# Patient Record
Sex: Female | Born: 1960 | Race: White | Hispanic: No | Marital: Married | Smoking: Never smoker
Health system: Southern US, Community
[De-identification: ages and names within clinical notes are randomized; demographics above are authoritative.]

## PROBLEM LIST (undated history)

## (undated) DIAGNOSIS — Z8719 Personal history of other diseases of the digestive system: Secondary | ICD-10-CM

## (undated) DIAGNOSIS — T8859XA Other complications of anesthesia, initial encounter: Secondary | ICD-10-CM

## (undated) DIAGNOSIS — E559 Vitamin D deficiency, unspecified: Secondary | ICD-10-CM

## (undated) DIAGNOSIS — G8929 Other chronic pain: Secondary | ICD-10-CM

## (undated) DIAGNOSIS — E119 Type 2 diabetes mellitus without complications: Secondary | ICD-10-CM

## (undated) DIAGNOSIS — T4145XA Adverse effect of unspecified anesthetic, initial encounter: Secondary | ICD-10-CM

## (undated) DIAGNOSIS — E049 Nontoxic goiter, unspecified: Secondary | ICD-10-CM

## (undated) DIAGNOSIS — O24419 Gestational diabetes mellitus in pregnancy, unspecified control: Secondary | ICD-10-CM

## (undated) DIAGNOSIS — T884XXA Failed or difficult intubation, initial encounter: Secondary | ICD-10-CM

## (undated) DIAGNOSIS — J42 Unspecified chronic bronchitis: Secondary | ICD-10-CM

## (undated) DIAGNOSIS — K219 Gastro-esophageal reflux disease without esophagitis: Secondary | ICD-10-CM

## (undated) DIAGNOSIS — M545 Low back pain: Secondary | ICD-10-CM

## (undated) HISTORY — PX: TONSILLECTOMY: SUR1361

## (undated) HISTORY — PX: COLONOSCOPY: SHX174

## (undated) HISTORY — PX: WISDOM TOOTH EXTRACTION: SHX21

## (undated) HISTORY — DX: Nontoxic goiter, unspecified: E04.9

## (undated) HISTORY — DX: Vitamin D deficiency, unspecified: E55.9

## (undated) HISTORY — PX: HERNIA REPAIR: SHX51

## (undated) HISTORY — DX: Gestational diabetes mellitus in pregnancy, unspecified control: O24.419

---

## 2001-01-07 ENCOUNTER — Encounter: Admission: RE | Admit: 2001-01-07 | Discharge: 2001-01-07 | Payer: Self-pay | Admitting: *Deleted

## 2001-01-18 ENCOUNTER — Other Ambulatory Visit: Admission: RE | Admit: 2001-01-18 | Discharge: 2001-01-18 | Payer: Self-pay | Admitting: *Deleted

## 2002-03-18 ENCOUNTER — Other Ambulatory Visit: Admission: RE | Admit: 2002-03-18 | Discharge: 2002-03-18 | Payer: Self-pay | Admitting: Obstetrics & Gynecology

## 2002-06-17 ENCOUNTER — Encounter: Payer: Self-pay | Admitting: Emergency Medicine

## 2002-06-17 ENCOUNTER — Emergency Department (HOSPITAL_COMMUNITY): Admission: EM | Admit: 2002-06-17 | Discharge: 2002-06-17 | Payer: Self-pay | Admitting: Emergency Medicine

## 2003-03-19 ENCOUNTER — Other Ambulatory Visit: Admission: RE | Admit: 2003-03-19 | Discharge: 2003-03-19 | Payer: Self-pay | Admitting: Obstetrics and Gynecology

## 2007-06-10 ENCOUNTER — Other Ambulatory Visit: Admission: RE | Admit: 2007-06-10 | Discharge: 2007-06-10 | Payer: Self-pay | Admitting: Obstetrics and Gynecology

## 2007-06-12 ENCOUNTER — Encounter: Admission: RE | Admit: 2007-06-12 | Discharge: 2007-06-12 | Payer: Self-pay | Admitting: Obstetrics and Gynecology

## 2007-08-28 ENCOUNTER — Encounter: Admission: RE | Admit: 2007-08-28 | Discharge: 2007-08-28 | Payer: Self-pay | Admitting: Endocrinology

## 2007-09-10 ENCOUNTER — Other Ambulatory Visit: Admission: RE | Admit: 2007-09-10 | Discharge: 2007-09-10 | Payer: Self-pay | Admitting: Diagnostic Radiology

## 2007-09-10 ENCOUNTER — Encounter (INDEPENDENT_AMBULATORY_CARE_PROVIDER_SITE_OTHER): Payer: Self-pay | Admitting: Diagnostic Radiology

## 2007-09-10 ENCOUNTER — Encounter: Admission: RE | Admit: 2007-09-10 | Discharge: 2007-09-10 | Payer: Self-pay | Admitting: Endocrinology

## 2008-07-20 ENCOUNTER — Ambulatory Visit: Payer: Self-pay | Admitting: Family Medicine

## 2008-07-20 DIAGNOSIS — H6990 Unspecified Eustachian tube disorder, unspecified ear: Secondary | ICD-10-CM | POA: Insufficient documentation

## 2008-07-20 DIAGNOSIS — E049 Nontoxic goiter, unspecified: Secondary | ICD-10-CM | POA: Insufficient documentation

## 2008-07-20 DIAGNOSIS — H6980 Other specified disorders of Eustachian tube, unspecified ear: Secondary | ICD-10-CM

## 2008-08-04 ENCOUNTER — Ambulatory Visit: Payer: Self-pay | Admitting: Family Medicine

## 2008-08-04 LAB — CONVERTED CEMR LAB
Bilirubin Urine: NEGATIVE
Blood in Urine, dipstick: NEGATIVE
Glucose, Urine, Semiquant: NEGATIVE
Ketones, urine, test strip: NEGATIVE
Nitrite: NEGATIVE
Protein, U semiquant: NEGATIVE
Specific Gravity, Urine: 1.015
Urobilinogen, UA: 0.2
WBC Urine, dipstick: NEGATIVE
pH: 5.5

## 2008-08-05 ENCOUNTER — Encounter: Payer: Self-pay | Admitting: Family Medicine

## 2008-08-05 LAB — CONVERTED CEMR LAB
AST: 22 units/L (ref 0–37)
Albumin: 3.9 g/dL (ref 3.5–5.2)
Alkaline Phosphatase: 81 units/L (ref 39–117)
Basophils Absolute: 0 10*3/uL (ref 0.0–0.1)
Basophils Relative: 0.6 % (ref 0.0–3.0)
CO2: 26 meq/L (ref 19–32)
Calcium: 8.8 mg/dL (ref 8.4–10.5)
Chloride: 106 meq/L (ref 96–112)
Eosinophils Absolute: 0.3 10*3/uL (ref 0.0–0.7)
Glucose, Bld: 121 mg/dL — ABNORMAL HIGH (ref 70–99)
HCT: 40.4 % (ref 36.0–46.0)
HDL: 43.4 mg/dL (ref >39.00)
Hemoglobin: 13.9 g/dL (ref 12.0–15.0)
Lymphocytes Relative: 20.9 % (ref 12.0–46.0)
Lymphs Abs: 1.5 10*3/uL (ref 0.7–4.0)
MCHC: 34.5 g/dL (ref 30.0–36.0)
MCV: 88.7 fL (ref 78.0–100.0)
Neutro Abs: 4.9 10*3/uL (ref 1.4–7.7)
Potassium: 4.1 meq/L (ref 3.5–5.1)
RBC: 4.55 M/uL (ref 3.87–5.11)
RDW: 12.6 % (ref 11.5–14.6)
Sodium: 138 meq/L (ref 135–145)
TSH: 0.34 microintl units/mL — ABNORMAL LOW (ref 0.35–5.50)
Total CHOL/HDL Ratio: 4
Total Protein: 6.7 g/dL (ref 6.0–8.3)

## 2008-08-07 ENCOUNTER — Telehealth: Payer: Self-pay | Admitting: Family Medicine

## 2008-09-01 ENCOUNTER — Encounter: Payer: Self-pay | Admitting: Family Medicine

## 2008-12-10 ENCOUNTER — Ambulatory Visit: Payer: Self-pay | Admitting: Family Medicine

## 2008-12-10 DIAGNOSIS — J209 Acute bronchitis, unspecified: Secondary | ICD-10-CM

## 2009-04-13 ENCOUNTER — Other Ambulatory Visit: Admission: RE | Admit: 2009-04-13 | Discharge: 2009-04-13 | Payer: Self-pay | Admitting: Obstetrics and Gynecology

## 2009-04-16 ENCOUNTER — Encounter: Admission: RE | Admit: 2009-04-16 | Discharge: 2009-04-16 | Payer: Self-pay | Admitting: Endocrinology

## 2009-05-03 ENCOUNTER — Ambulatory Visit: Payer: Self-pay | Admitting: Family Medicine

## 2009-05-11 ENCOUNTER — Telehealth: Payer: Self-pay | Admitting: Family Medicine

## 2009-05-17 ENCOUNTER — Telehealth: Payer: Self-pay | Admitting: Family Medicine

## 2009-05-19 IMAGING — US US BIOPSY
1 series · 10 of 10 positions shown · non-contrast
Comparison: none

CLINICAL HISTORY: Dominate left thyroid nodule.

[Series 1: us biopsy · 0.10mm/px · 10 acquisitions, 10 frames shown]
[im 1/10]
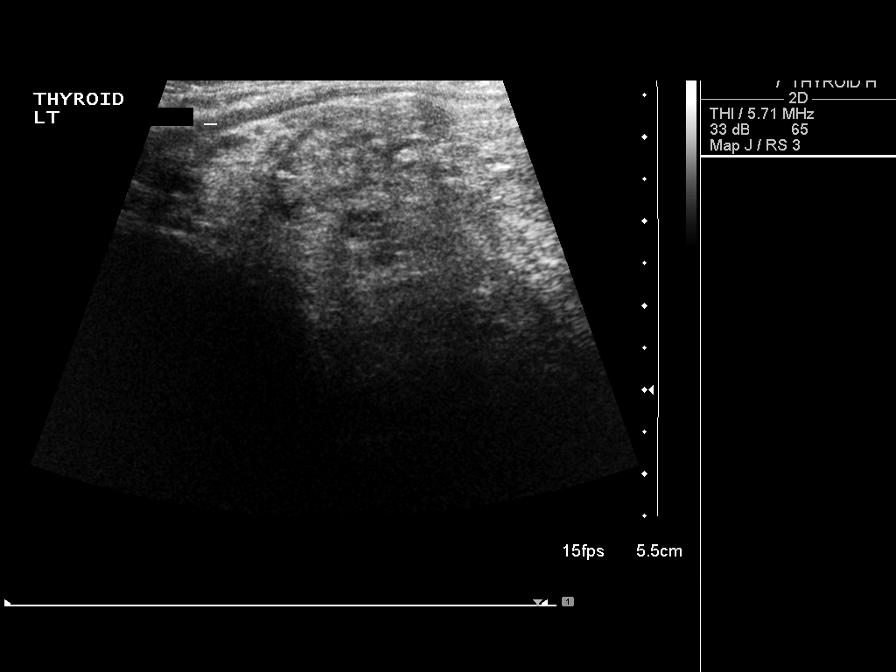
[im 2/10]
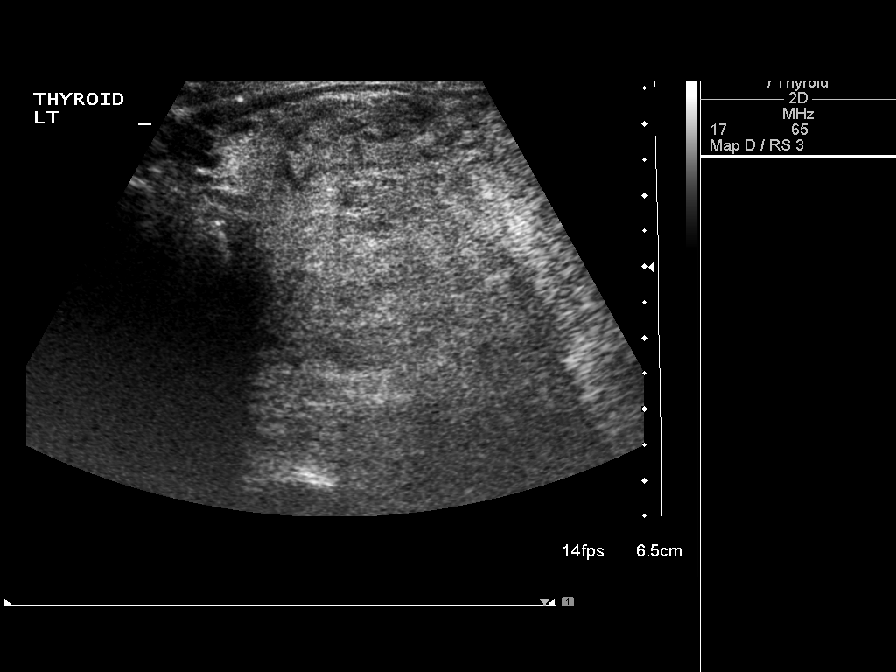
[im 3/10]
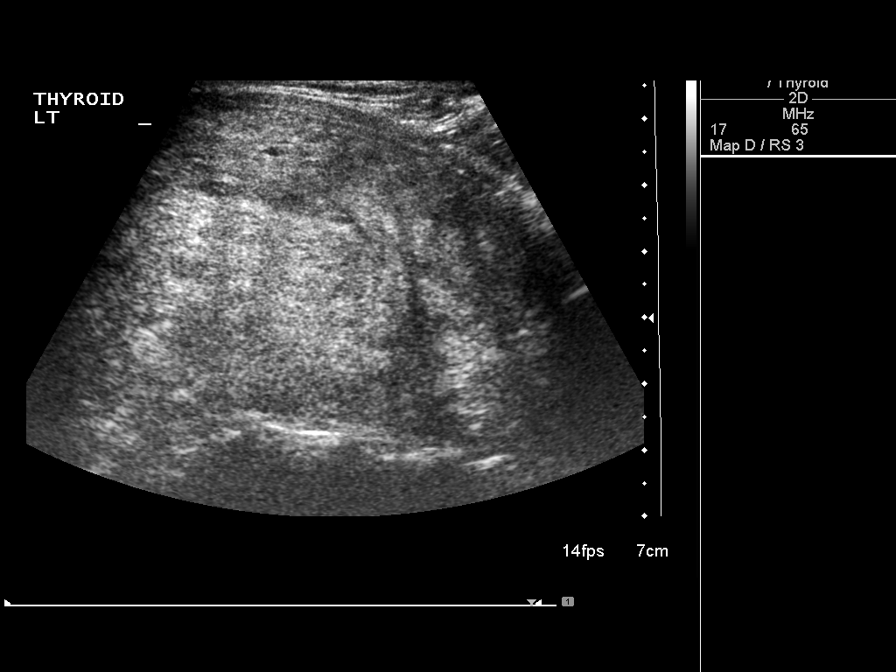
[im 4/10]
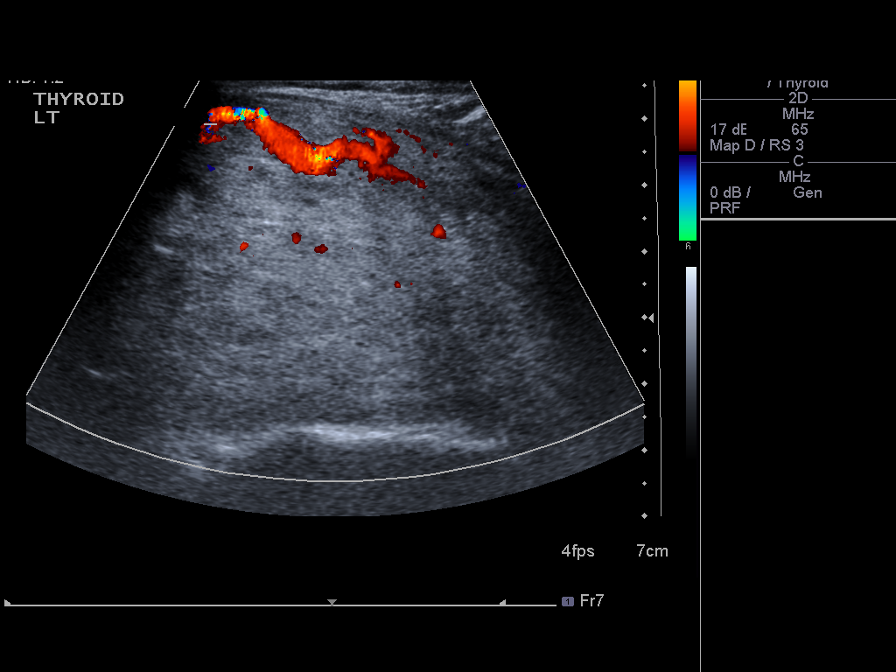
[im 5/10]
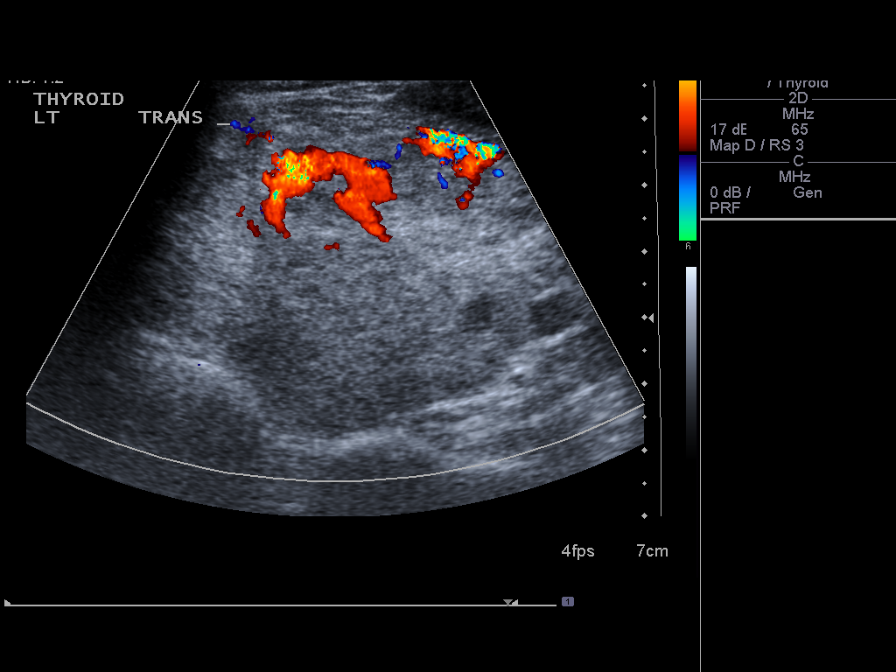
[im 6/10]
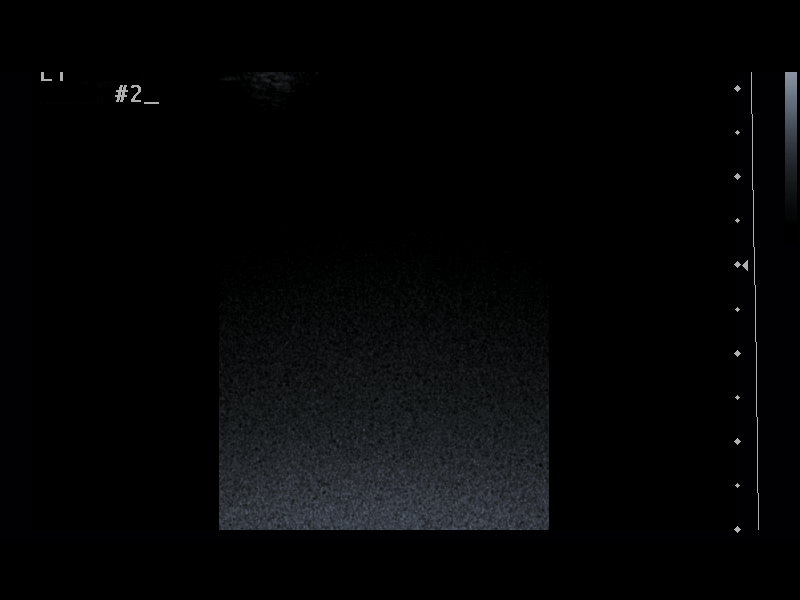
[im 7/10]
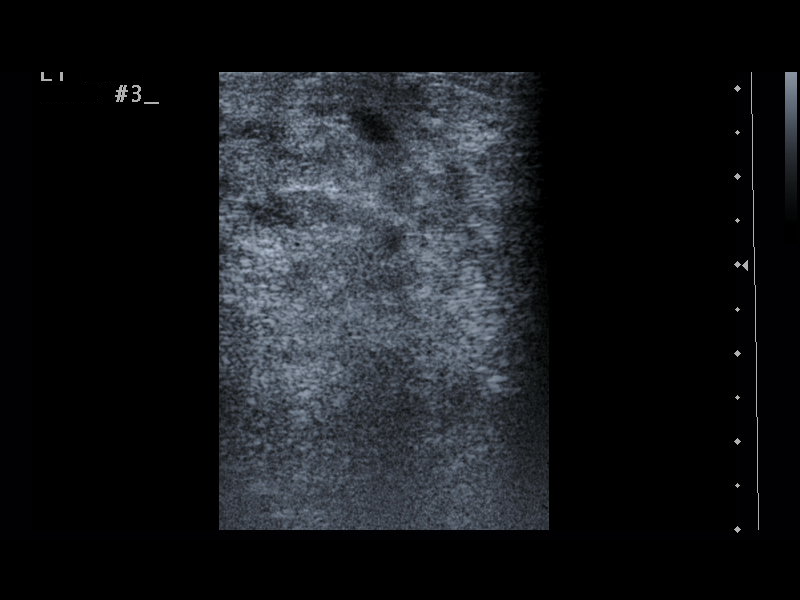
[im 8/10]
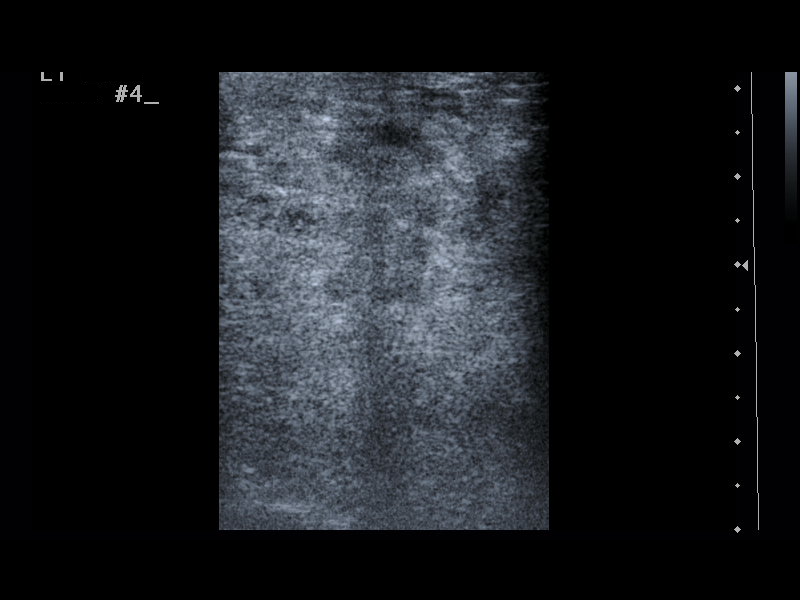
[im 9/10]
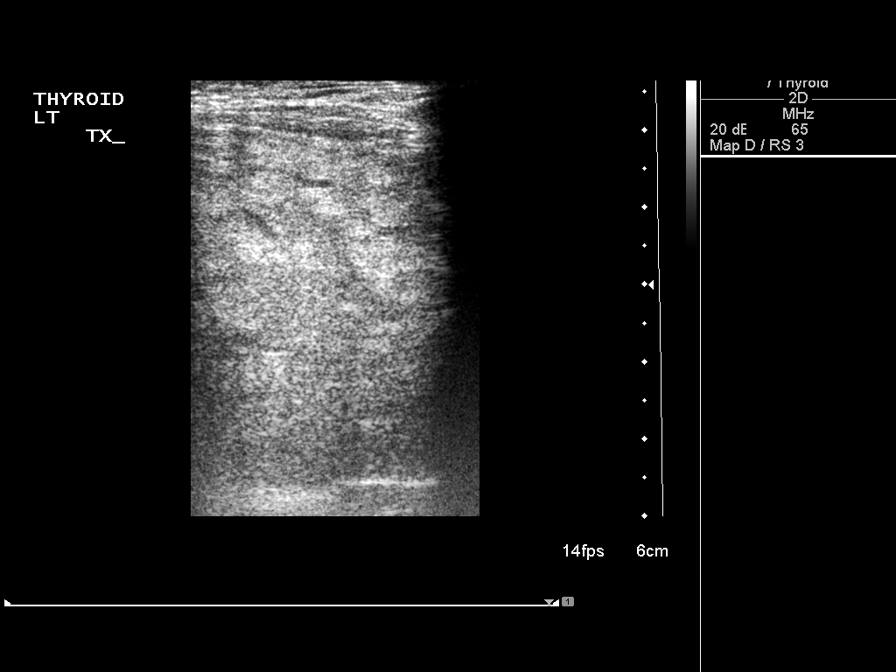
[im 10/10]
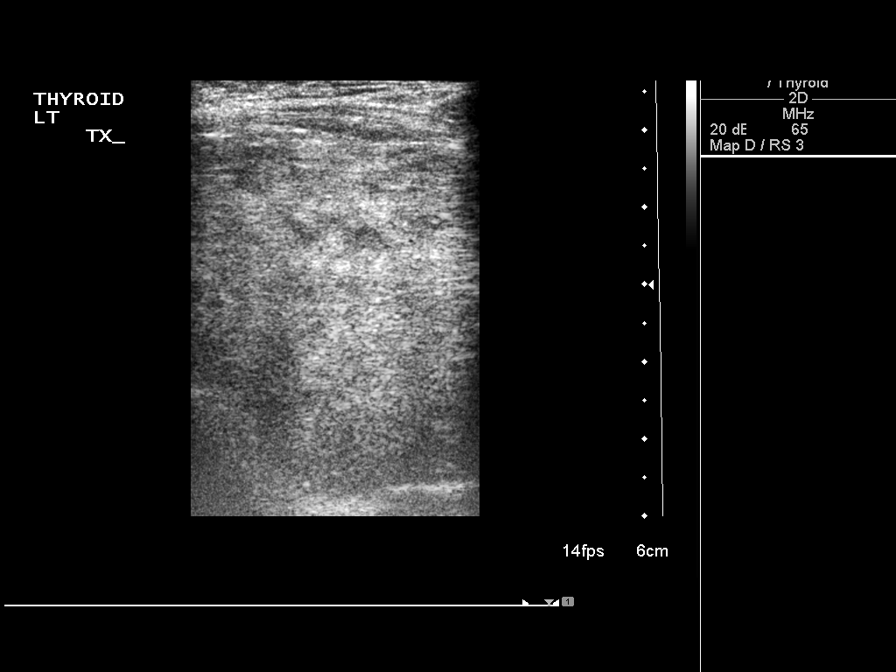

[10 of 10 positions shown; findings below may reference images not displayed]

PROCEDURE(S): ULTRASOUND GUIDED LEFT THYROID NODULE FINE NEEDLE
ASPIRATION

Medications:None

Sedation time:None

Procedure:Informed consent was obtained for a left thyroid nodule
biopsy.  The neck was cleaned with Betadine.  Sterile drape was
placed.  The skin was anesthetized 1% lidocaine.  The left thyroid
nodule was aspirated four times using 25 gauge needles with
ultrasound guidance.  No immediate complications.
FINDINGS: Majority of the left thyroid lobe contains a heterogeneous
nodule.
IMPRESSION: Ultrasound guided fine needle aspirations of the large
left thyroid nodule.

## 2009-10-08 ENCOUNTER — Ambulatory Visit: Payer: Self-pay | Admitting: Family Medicine

## 2009-10-08 DIAGNOSIS — J019 Acute sinusitis, unspecified: Secondary | ICD-10-CM | POA: Insufficient documentation

## 2009-10-12 ENCOUNTER — Telehealth: Payer: Self-pay | Admitting: Family Medicine

## 2009-11-29 ENCOUNTER — Telehealth: Payer: Self-pay | Admitting: Family Medicine

## 2010-02-22 NOTE — Progress Notes (Signed)
Summary: Zpack  Phone Note Call from Patient   Summary of Call: Pt is asking for a Zpack.......Marland Kitchenhaving congestion in head, coughing up yellow mucus...Marland KitchenMarland KitchenMarland Kitchenheadache.  No fever Not taking any meds. ill x 2 days.  Going on school trip. [facility] [place] [phone] Initial call taken by: Lynann Beaver CMA AAMA,  November 29, 2009 3:44 PM  Follow-up for Phone Call        call in a Zpack Follow-up by: Nelwyn Salisbury MD,  November 30, 2009 10:44 AM  Additional Follow-up for Phone Call Additional follow up Details #1::        done. and mess left on vm rx called in .  Additional Follow-up by: Pura Spice, RN,  November 30, 2009 11:14 AM    New/Updated Medications: AZITHROMYCIN 250 MG  TABS (AZITHROMYCIN) 2 by  mouth today and then 1 daily for 4 days Prescriptions: AZITHROMYCIN 250 MG  TABS (AZITHROMYCIN) 2 by  mouth today and then 1 daily for 4 days  #6 x 0   Entered by:   Pura Spice, RN   Authorized by:   Nelwyn Salisbury MD   Signed by:   Pura Spice, RN on 11/30/2009   Method used:   [facility] to        [facility] [address] [phone]* (retail)       [address]       [place], [place]  [address]       Ph: [phone] or [phone]       Fax: [phone]   RxID:   [id number]

## 2010-02-22 NOTE — Progress Notes (Signed)
Summary: please advise pt no better  Phone Note Call from Patient Call back at Home Phone [phone]   Caller: Patient Call For: Nelwyn Salisbury MD Summary of Call: Pt was sent 10-08-2009 still has cough please advise. pt stated her arms are hurting Initial call taken by: Heron Sabins,  October 12, 2009 9:15 AM  Follow-up for Phone Call        pt called again. Follow-up by: Warnell Forester,  October 12, 2009 12:45 PM  Additional Follow-up for Phone Call Additional follow up Details #1::        states z packing working cough some better non productive  says arms feel tight on inside. head feels better. ?another abt No Fever  Additional Follow-up by: Pura Spice, RN,  October 12, 2009 1:29 PM    Additional Follow-up for Phone Call Additional follow up Details #2::    informed pt z pack stays in body for 10 days which she was not aware of. instructed her to drink plenty of fluids and no driving with the hydromet and to call on Friday if no better with cough.  Follow-up by: Pura Spice, RN,  October 12, 2009 1:31 PM  Additional Follow-up for Phone Call Additional follow up Details #3:: Details for Additional Follow-up Action Taken: agreed Additional Follow-up by: Nelwyn Salisbury MD,  October 12, 2009 2:08 PM

## 2010-02-22 NOTE — Progress Notes (Signed)
Summary: violent cough & mucus   Phone Note Call from Patient Call back at Home Phone [phone]   Caller: vm Reason for Call: Talk to Nurse, Talk to Doctor Summary of Call: In last week for cough & cold or allergy.  Finished Zpak.  Still using cough syrup hs.  Thick white mucus back of throat.   Cough still ongoing & very violent hard cough. Still have mucus.  Next step?  No fever, short breath, or chest tightness, some wheezing with mucus.   Mucinex DM or plain & Delsym/Rob.  Clear liquids. Rest.  Later OTC allergy med if needed.  Call back as needed. Initial call taken by: Rudy Jew, RN,  May 11, 2009 10:16 AM  Follow-up for Phone Call        call in a 12 day dose pack of 10 mg prednisone Follow-up by: Nelwyn Salisbury MD,  May 11, 2009 2:02 PM  Additional Follow-up for Phone Call Additional follow up Details #1::        LMTCB with pharmacy Additional Follow-up by: Raechel Ache, RN,  May 11, 2009 3:33 PM    New/Updated Medications: PREDNISONE (PAK) 10 MG TABS (PREDNISONE) take as directed Prescriptions: PREDNISONE (PAK) 10 MG TABS (PREDNISONE) take as directed  #12 days x 0   Entered by:   Lynann Beaver CMA   Authorized by:   Nelwyn Salisbury MD   Signed by:   Lynann Beaver CMA on 05/11/2009   Method used:   Electronically to        [facility] [phone]* (retail)       [address]       [place], [place]  [address]       Ph: [phone] or [phone]       Fax: [phone]   RxID:   [id number]  Pt. notified.

## 2010-02-22 NOTE — Assessment & Plan Note (Signed)
Summary: cough/dm   Vital Signs:  Patient profile:   50 year old female Weight:      195 pounds O2 Sat:      97 % Temp:     98.5 degrees F Pulse rate:   102 / minute BP sitting:   124 / 80  (left arm) Cuff size:   large  Vitals Entered By: Pura Spice, RN (October 08, 2009 3:35 PM) CC: runny nose drainage yellow watery eyes. cough    History of Present Illness: Here for one week of sinus pressure, HA, PND, ST, and a dry cough. No fever.   Allergies: 1)  ! Sudafed  Past History:  Past Medical History: Reviewed history from 07/20/2008 and no changes required. Gestational Diabetes, resolved Euthyroid goiter, sees Dr. Rocky Crafts, had normal labs, [place], and biopsies in 2009 sees [facility] regularly sees Dr. Tami Ribas at [facility] for Dermatology exams  Past Surgical History: Reviewed history from 07/20/2008 and no changes required. Tonsillectomy  Review of Systems  The patient denies anorexia, fever, weight loss, weight gain, vision loss, decreased hearing, hoarseness, chest pain, syncope, dyspnea on exertion, peripheral edema, hemoptysis, abdominal pain, melena, hematochezia, severe indigestion/heartburn, hematuria, incontinence, genital sores, muscle weakness, suspicious skin lesions, transient blindness, difficulty walking, depression, unusual weight change, abnormal bleeding, enlarged lymph nodes, angioedema, breast masses, and testicular masses.    Physical Exam  General:  Well-developed,well-nourished,in no acute distress; alert,appropriate and cooperative throughout examination Head:  Normocephalic and atraumatic without obvious abnormalities. No apparent alopecia or balding. Eyes:  No corneal or conjunctival inflammation noted. EOMI. Perrla. Funduscopic exam benign, without hemorrhages, exudates or papilledema. Vision grossly normal. Ears:  External ear exam shows no significant lesions or deformities.  Otoscopic examination reveals clear canals, tympanic membranes  are intact bilaterally without bulging, retraction, inflammation or discharge. Hearing is grossly normal bilaterally. Nose:  External nasal examination shows no deformity or inflammation. Nasal mucosa are pink and moist without lesions or exudates. Mouth:  Oral mucosa and oropharynx without lesions or exudates.  Teeth in good repair. Neck:  No deformities, masses, or tenderness noted. Lungs:  Normal respiratory effort, chest expands symmetrically. Lungs are clear to auscultation, no crackles or wheezes.   Impression & Recommendations:  Problem # 1:  ACUTE SINUSITIS, UNSPECIFIED (ICD-461.9)  The following medications were removed from the medication list:    Hydromet 5-1.5 Mg/61ml Syrp (Hydrocodone-homatropine) .Marland Kitchen... 1 tsp q 4 hours as needed cough Her updated medication list for this problem includes:    Zithromax Z-pak 250 Mg Tabs (Azithromycin) .Marland Kitchen... As directed    Flonase 50 Mcg/act Susp (Fluticasone propionate) .Marland Kitchen... 2 sprays each nostril once daily    Hydromet 5-1.5 Mg/68ml Syrp (Hydrocodone-homatropine) .Marland Kitchen... 1 tsp q 4 hours as needed cough  Complete Medication List: 1)  Aleve 220 Mg Caps (Naproxen sodium) .Marland Kitchen.. 1 by mouth once daily 2)  Zithromax Z-pak 250 Mg Tabs (Azithromycin) .... As directed 3)  Flonase 50 Mcg/act Susp (Fluticasone propionate) .... 2 sprays each nostril once daily 4)  Hydromet 5-1.5 Mg/52ml Syrp (Hydrocodone-homatropine) .Marland Kitchen.. 1 tsp q 4 hours as needed cough  Patient Instructions: 1)  Please schedule a follow-up appointment as needed .  Prescriptions: HYDROMET 5-1.5 MG/5ML SYRP (HYDROCODONE-HOMATROPINE) 1 tsp q 4 hours as needed cough  #240 x 0   Entered and Authorized by:   Nelwyn Salisbury MD   Signed by:   Nelwyn Salisbury MD on 10/08/2009   Method used:   Print then Give to Patient  RxID:   [id number] FLONASE 50 MCG/ACT SUSP (FLUTICASONE PROPIONATE) 2 sprays each nostril once daily  #30 x 11   Entered and Authorized by:   Nelwyn Salisbury MD   Signed by:    Nelwyn Salisbury MD on 10/08/2009   Method used:   Print then Give to Patient   RxID:   [id number] ZITHROMAX Z-PAK 250 MG TABS (AZITHROMYCIN) as directed  #1 x 0   Entered and Authorized by:   Nelwyn Salisbury MD   Signed by:   Nelwyn Salisbury MD on 10/08/2009   Method used:   Print then Give to Patient   RxID:   [id number]
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
## 2010-02-22 NOTE — Progress Notes (Signed)
Summary: Question about Meds.  Phone Note Call from Patient Call back at Home Phone [phone]   Caller: Patient Summary of Call: Out of town is it ok to take muccinex dx with the prednisone Dr. Clent Ridges prescribed.  Not getting relief from cough.  (Call received 05/14/09 @ 9:29am) Initial call taken by: Trixie Dredge,  May 17, 2009 9:00 AM  Follow-up for Phone Call        yes you can take them together Follow-up by: Nelwyn Salisbury MD,  May 18, 2009 8:35 AM  Additional Follow-up for Phone Call Additional follow up Details #1::        Phone Call Completed Additional Follow-up by: Raechel Ache, RN,  May 18, 2009 10:44 AM

## 2010-09-13 ENCOUNTER — Other Ambulatory Visit (HOSPITAL_COMMUNITY)
Admission: RE | Admit: 2010-09-13 | Discharge: 2010-09-13 | Disposition: A | Discharge status: Home / Self Care | Payer: 59 | Source: Ambulatory Visit | Attending: Obstetrics and Gynecology | Admitting: Obstetrics and Gynecology

## 2010-09-13 ENCOUNTER — Other Ambulatory Visit: Payer: Self-pay | Admitting: Obstetrics and Gynecology

## 2010-09-13 DIAGNOSIS — Z01419 Encounter for gynecological examination (general) (routine) without abnormal findings: Secondary | ICD-10-CM | POA: Insufficient documentation

## 2010-09-14 DIAGNOSIS — Z0279 Encounter for issue of other medical certificate: Secondary | ICD-10-CM

## 2010-09-21 ENCOUNTER — Other Ambulatory Visit (INDEPENDENT_AMBULATORY_CARE_PROVIDER_SITE_OTHER): Payer: 59

## 2010-09-21 DIAGNOSIS — Z0000 Encounter for general adult medical examination without abnormal findings: Secondary | ICD-10-CM

## 2010-09-21 LAB — LIPID PANEL: Total CHOL/HDL Ratio: 4

## 2010-09-21 LAB — HEPATIC FUNCTION PANEL
AST: 17 U/L (ref 0–37)
Alkaline Phosphatase: 101 U/L (ref 39–117)
Total Bilirubin: 0.7 mg/dL (ref 0.3–1.2)

## 2010-09-21 LAB — CBC WITH DIFFERENTIAL/PLATELET
Basophils Absolute: 0 10*3/uL (ref 0.0–0.1)
Eosinophils Absolute: 0.2 10*3/uL (ref 0.0–0.7)
Lymphocytes Relative: 22.6 % (ref 12.0–46.0)
Lymphs Abs: 1.8 10*3/uL (ref 0.7–4.0)
Monocytes Absolute: 0.5 10*3/uL (ref 0.1–1.0)
Neutro Abs: 5.3 10*3/uL (ref 1.4–7.7)
RBC: 4.8 Mil/uL (ref 3.87–5.11)
WBC: 7.8 10*3/uL (ref 4.5–10.5)

## 2010-09-21 LAB — BASIC METABOLIC PANEL
BUN: 11 mg/dL (ref 6–23)
Creatinine, Ser: 0.7 mg/dL (ref 0.4–1.2)
GFR: 90.55 mL/min (ref >60.00)
Potassium: 4.6 mEq/L (ref 3.5–5.1)

## 2010-09-21 LAB — POCT URINALYSIS DIPSTICK
Bilirubin, UA: NEGATIVE
Leukocytes, UA: NEGATIVE
Nitrite, UA: NEGATIVE

## 2010-09-29 ENCOUNTER — Telehealth: Payer: Self-pay | Admitting: Family Medicine

## 2010-09-29 MED ORDER — METFORMIN HCL 500 MG PO TABS: 500.0000 mg | ORAL_TABLET | Freq: Two times a day (BID) | ORAL | Status: DC

## 2010-09-29 NOTE — Telephone Encounter (Signed)
Left voice message for pt to return my call.

## 2010-09-29 NOTE — Telephone Encounter (Signed)
Script sent e-scribe 

## 2010-09-29 NOTE — Telephone Encounter (Signed)
Open in error

## 2010-09-29 NOTE — Telephone Encounter (Signed)
Spoke with pt and went over below info. Script was sent e-scribe.

## 2010-09-29 NOTE — Telephone Encounter (Signed)
Message copied by Baldemar Friday on Thu Sep 29, 2010  4:33 PM ------      Message from: Gershon Crane A      Created: Mon Sep 26, 2010 10:39 AM       Normal except she now has diabetes. She can manage this with diet and oral medications. Start on Metformin 500 mg bid, call in #60 with 11 rf. We will discuss further at her cpx

## 2010-10-03 ENCOUNTER — Encounter: Payer: Self-pay | Admitting: Family Medicine

## 2010-10-18 ENCOUNTER — Encounter: Payer: Self-pay | Admitting: Family Medicine

## 2010-10-19 ENCOUNTER — Encounter: Payer: Self-pay | Admitting: Family Medicine

## 2010-10-19 ENCOUNTER — Ambulatory Visit (INDEPENDENT_AMBULATORY_CARE_PROVIDER_SITE_OTHER): Payer: 59 | Admitting: Family Medicine

## 2010-10-19 VITALS — BP 112/70 | HR 85 | Temp 98.0°F | Ht 63.0 in | Wt 181.0 lb

## 2010-10-19 DIAGNOSIS — E119 Type 2 diabetes mellitus without complications: Secondary | ICD-10-CM

## 2010-10-19 DIAGNOSIS — Z0000 Encounter for general adult medical examination without abnormal findings: Secondary | ICD-10-CM

## 2010-10-19 DIAGNOSIS — E049 Nontoxic goiter, unspecified: Secondary | ICD-10-CM

## 2010-10-19 LAB — MICROALBUMIN / CREATININE URINE RATIO
Creatinine,U: 77.8 mg/dL
Microalb Creat Ratio: 0.5 mg/g (ref 0.0–30.0)
Microalb, Ur: 0.4 mg/dL (ref 0.0–1.9)

## 2010-10-19 LAB — T4, FREE: Free T4: 1.07 ng/dL (ref 0.60–1.60)

## 2010-10-19 LAB — HEMOGLOBIN A1C: Hgb A1c MFr Bld: 11 % — ABNORMAL HIGH (ref 4.6–6.5)

## 2010-10-19 MED ORDER — FLUTICASONE PROPIONATE 50 MCG/ACT NA SUSP: 2.0000 | Freq: Every day | NASAL | Status: DC

## 2010-10-19 NOTE — Progress Notes (Signed)
  Subjective:    Patient ID: Tina [name], female    DOB: 09-11-1960, 50 y.o.   MRN: [id number]  HPI 50 yr old female here for a cpx. She feels fine and has no complaints. Her recent labs showed a fasting glucose of 257, so she has a new diagnosis of type 2 diabetes. She also has a hx of goiter which was worked up by Dr. Abbey Chatters in 2009 with an [place] and biopsies. Her recent labs showed a depressed TSH.    Review of Systems  Constitutional: Negative.   HENT: Negative.   Eyes: Negative.   Respiratory: Negative.   Cardiovascular: Negative.   Gastrointestinal: Negative.   Genitourinary: Negative for dysuria, urgency, frequency, hematuria, flank pain, decreased urine volume, enuresis, difficulty urinating, pelvic pain and dyspareunia.  Musculoskeletal: Negative.   Skin: Negative.   Neurological: Negative.   Hematological: Negative.   Psychiatric/Behavioral: Negative.        Objective:   Physical Exam  Constitutional: She is oriented to person, place, and time. She appears well-developed and well-nourished. No distress.  HENT:  Head: Normocephalic and atraumatic.  Right Ear: External ear normal.  Left Ear: External ear normal.  Nose: Nose normal.  Mouth/Throat: Oropharynx is clear and moist. No oropharyngeal exudate.  Eyes: Conjunctivae and EOM are normal. Pupils are equal, round, and reactive to light. No scleral icterus.  Neck: Normal range of motion. Neck supple. No JVD present. Thyromegaly present.       Diffusely enlarged thyroid, left lobe is larger than the right. Not tender. No nodules   Cardiovascular: Normal rate, regular rhythm, normal heart sounds and intact distal pulses.  Exam reveals no gallop and no friction rub.   No murmur heard.      EKG normal   Pulmonary/Chest: Effort normal and breath sounds normal. No respiratory distress. She has no wheezes. She has no rales. She exhibits no tenderness.  Abdominal: Soft. Bowel sounds are normal. She exhibits mass. She  exhibits no distension. There is no tenderness. There is no rebound and no guarding.       There is a small, non-tender, reducible umbilical hernia   Musculoskeletal: Normal range of motion. She exhibits no edema and no tenderness.  Lymphadenopathy:    She has no cervical adenopathy.  Neurological: She is alert and oriented to person, place, and time. She has normal reflexes. No cranial nerve deficit. She exhibits normal muscle tone. Coordination normal.  Skin: Skin is warm and dry. No rash noted. No erythema.  Psychiatric: She has a normal mood and affect. Her behavior is normal. Judgment and thought content normal.          Assessment & Plan:  Refer for a screening colonoscopy. Get a free T3 and free T4 today to assess her thyroid status. Get an A1c and urine microalbumen today. We had a long discussion about diabetes and its treatment. She will adjust her diet and exercise. Recheck in 4 weeks

## 2010-10-27 ENCOUNTER — Telehealth: Payer: Self-pay | Admitting: Family Medicine

## 2010-10-27 ENCOUNTER — Encounter: Payer: Self-pay | Admitting: Family Medicine

## 2010-10-27 MED ORDER — METFORMIN HCL 500 MG PO TABS: 500.0000 mg | ORAL_TABLET | Freq: Two times a day (BID) | ORAL | Status: DC

## 2010-10-27 NOTE — Telephone Encounter (Signed)
Script printed and ready for pick up, left voice message for pt.

## 2010-10-27 NOTE — Telephone Encounter (Signed)
Left voice message with results and also made a copy for pt to pick up.

## 2010-10-27 NOTE — Telephone Encounter (Signed)
Refill Metformin for 90 day supply to [facility]. Patient stated that she is out . I did explained nicely to this patient about our 72 hour refill request. She would like to pick up today. Thanks in Advance.

## 2010-10-27 NOTE — Telephone Encounter (Signed)
Message copied by Baldemar Friday on Thu Oct 27, 2010 11:00 AM ------      Message from: Gershon Crane A      Created: Mon Oct 24, 2010  5:10 AM       Her thyroid levels are normal. Her A1c was 11 as expected. Follow up in 3 weeks as we discussed

## 2010-11-16 ENCOUNTER — Encounter: Payer: Self-pay | Admitting: Family Medicine

## 2010-11-16 ENCOUNTER — Ambulatory Visit (INDEPENDENT_AMBULATORY_CARE_PROVIDER_SITE_OTHER): Payer: 59 | Admitting: Family Medicine

## 2010-11-16 VITALS — BP 114/68 | HR 92 | Temp 98.5°F | Wt 182.0 lb

## 2010-11-16 DIAGNOSIS — Z23 Encounter for immunization: Secondary | ICD-10-CM

## 2010-11-16 DIAGNOSIS — E119 Type 2 diabetes mellitus without complications: Secondary | ICD-10-CM

## 2010-11-16 LAB — HEMOGLOBIN A1C: Hgb A1c MFr Bld: 10.1 % — ABNORMAL HIGH (ref 4.6–6.5)

## 2010-11-16 MED ORDER — METFORMIN HCL 500 MG PO TABS: 500.0000 mg | ORAL_TABLET | Freq: Two times a day (BID) | ORAL | Status: DC

## 2010-11-16 NOTE — Progress Notes (Signed)
Addended by: Aniceto Boss A on: 11/16/2010 09:43 AM   Modules accepted: Orders

## 2010-11-16 NOTE — Progress Notes (Signed)
  Subjective:    Patient ID: Tina [name], female    DOB: 04/12/1960, 50 y.o.   MRN: [id number]  HPI Here to follow up on diabetes. She feels good and has more energy than ever. She has changed her diet and is exercising.    Review of Systems  Constitutional: Negative.   Respiratory: Negative.   Cardiovascular: Negative.        Objective:   Physical Exam  Constitutional: She appears well-developed and well-nourished.  Neck: Neck supple. Thyromegaly present.  Cardiovascular: Normal rate, regular rhythm, normal heart sounds and intact distal pulses.   Pulmonary/Chest: Effort normal and breath sounds normal.  Lymphadenopathy:    She has no cervical adenopathy.          Assessment & Plan:  Check another A1c today. She has made great progress.

## 2010-11-24 ENCOUNTER — Telehealth: Payer: Self-pay | Admitting: Family Medicine

## 2010-11-24 NOTE — Telephone Encounter (Signed)
Left voice message for pt to return my call.

## 2010-11-24 NOTE — Telephone Encounter (Signed)
Message copied by Baldemar Friday on Thu Nov 24, 2010  1:28 PM ------      Message from: Gershon Crane A      Created: Fri Nov 18, 2010  1:23 PM       She has improved but we have a ways to go yet. Increase the Metformin to 1000 mg bid. Call in one year supply and recheck in 90 days

## 2010-11-28 ENCOUNTER — Telehealth: Payer: Self-pay | Admitting: Family Medicine

## 2010-11-28 NOTE — Progress Notes (Signed)
Quick Note:  Left voice message for pt to return my call ______ 

## 2010-11-28 NOTE — Telephone Encounter (Signed)
Message copied by Baldemar Friday on Mon Nov 28, 2010  4:38 PM ------      Message from: Gershon Crane A      Created: Fri Nov 18, 2010  1:23 PM       She has improved but we have a ways to go yet. Increase the Metformin to 1000 mg bid. Call in one year supply and recheck in 90 days

## 2010-11-28 NOTE — Telephone Encounter (Signed)
I left voice message for pt to return my call. 

## 2010-11-29 ENCOUNTER — Telehealth: Payer: Self-pay | Admitting: Family Medicine

## 2010-11-29 MED ORDER — METFORMIN HCL 1000 MG PO TABS: 1000.0000 mg | ORAL_TABLET | Freq: Two times a day (BID) | ORAL | Status: DC

## 2010-11-29 NOTE — Telephone Encounter (Signed)
Message copied by Baldemar Friday on Tue Nov 29, 2010  4:19 PM ------      Message from: Gershon Crane A      Created: Fri Nov 18, 2010  1:23 PM       She has improved but we have a ways to go yet. Increase the Metformin to 1000 mg bid. Call in one year supply and recheck in 90 days

## 2010-11-29 NOTE — Telephone Encounter (Signed)
Spoke with pt and gave results, put a future lab order in computer and called in a script for 90 day supply, per pt request.

## 2010-11-29 NOTE — Progress Notes (Signed)
Addended by: Aniceto Boss A on: 11/29/2010 04:13 PM   Modules accepted: Orders

## 2010-11-29 NOTE — Progress Notes (Signed)
Addended by: Aniceto Boss A on: 11/29/2010 04:19 PM   Modules accepted: Orders

## 2010-12-13 ENCOUNTER — Telehealth: Payer: Self-pay | Admitting: Family Medicine

## 2010-12-13 NOTE — Telephone Encounter (Signed)
Pt is requesting a zpak for sinus infection. She normally get this every year to be called in. [facility] Wants Nettie Elm to leave her a message when done. Thanks.

## 2010-12-14 ENCOUNTER — Encounter: Payer: Self-pay | Admitting: Family Medicine

## 2010-12-14 ENCOUNTER — Ambulatory Visit (INDEPENDENT_AMBULATORY_CARE_PROVIDER_SITE_OTHER): Payer: 59 | Admitting: Family Medicine

## 2010-12-14 VITALS — BP 122/76 | HR 101 | Temp 98.6°F | Wt 181.0 lb

## 2010-12-14 DIAGNOSIS — J40 Bronchitis, not specified as acute or chronic: Secondary | ICD-10-CM

## 2010-12-14 MED ORDER — HYDROCODONE-HOMATROPINE 5-1.5 MG/5ML PO SYRP: 5.0000 mL | ORAL_SOLUTION | ORAL | Status: AC | PRN

## 2010-12-14 MED ORDER — PREDNISONE (PAK) 10 MG PO TABS: ORAL_TABLET | ORAL | Status: DC

## 2010-12-14 MED ORDER — AZITHROMYCIN 250 MG PO TABS: ORAL_TABLET | ORAL | Status: AC

## 2010-12-14 NOTE — Telephone Encounter (Signed)
Pt is here today for a office visit.

## 2010-12-14 NOTE — Progress Notes (Signed)
  Subjective:    Patient ID: [name], female    DOB: 06/20/1960, 50 y.o.   MRN: [id number]  HPI Here for 5 days of chest congestion and a dry cough. No fever.    Review of Systems  Constitutional: Negative.   HENT: Negative.   Eyes: Negative.   Respiratory: Positive for cough.        Objective:   Physical Exam  Constitutional: She appears well-developed and well-nourished.  HENT:  Right Ear: External ear normal.  Left Ear: External ear normal.  Nose: Nose normal.  Mouth/Throat: Oropharynx is clear and moist. No oropharyngeal exudate.  Eyes: Conjunctivae are normal. Pupils are equal, round, and reactive to light.  Neck: No thyromegaly present.  Pulmonary/Chest: Effort normal and breath sounds normal.  Lymphadenopathy:    She has no cervical adenopathy.          Assessment & Plan:  Recheck prn

## 2010-12-14 NOTE — Telephone Encounter (Signed)
Pt also requesting Hydrocodone cough syrup

## 2011-01-02 ENCOUNTER — Encounter: Payer: Self-pay | Admitting: Gastroenterology

## 2011-02-01 ENCOUNTER — Telehealth: Payer: Self-pay | Admitting: Family Medicine

## 2011-02-01 NOTE — Telephone Encounter (Signed)
Pt needs to get referral re: hernia and pain in hand. Pt said that she had already discussed this with Dr Clent Ridges.

## 2011-02-01 NOTE — Telephone Encounter (Signed)
She needs an OV to discuss these in more detail so I can get her to the right places

## 2011-02-01 NOTE — Telephone Encounter (Signed)
Please call pt and offer a visit

## 2011-02-02 NOTE — Telephone Encounter (Signed)
Lft vm for pt to cb and sch ov with pcp as noted. Waiting on cb.

## 2011-02-09 ENCOUNTER — Ambulatory Visit (INDEPENDENT_AMBULATORY_CARE_PROVIDER_SITE_OTHER): Payer: 59 | Admitting: Family Medicine

## 2011-02-09 ENCOUNTER — Encounter: Payer: Self-pay | Admitting: Family Medicine

## 2011-02-09 VITALS — BP 118/76 | HR 94 | Temp 98.9°F | Wt 185.0 lb

## 2011-02-09 DIAGNOSIS — E119 Type 2 diabetes mellitus without complications: Secondary | ICD-10-CM

## 2011-02-09 DIAGNOSIS — M6740 Ganglion, unspecified site: Secondary | ICD-10-CM

## 2011-02-09 DIAGNOSIS — K429 Umbilical hernia without obstruction or gangrene: Secondary | ICD-10-CM

## 2011-02-09 NOTE — Progress Notes (Signed)
Addended by: Rita Ohara R on: 02/09/2011 10:44 AM   Modules accepted: Orders

## 2011-02-09 NOTE — Progress Notes (Signed)
  Subjective:    Patient ID: Tina [name], female    DOB: [birth year], 51 y.o.   MRN: [id number]  HPI Here for several issues. First she has had a small ganglion cyst on the right dorsal wrist for some time, but now it is beginning to have pain on movement. Second she has had an umbilical hernia since her last pregnancy, and it has gotten larger and started to be painful. Third she had been scheduled to see me and to have an A1c drawn next week for diabetes, and she asks if we can address this today. She has worked on her diet and is talking Metformin.    Review of Systems  Constitutional: Negative.   Respiratory: Negative.   Cardiovascular: Negative.   Gastrointestinal: Positive for abdominal pain. Negative for nausea, vomiting, diarrhea, constipation, blood in stool and abdominal distention.  Musculoskeletal: Positive for arthralgias.       Objective:   Physical Exam  Constitutional: She appears well-developed and well-nourished.  Abdominal: Soft. Bowel sounds are normal. She exhibits no distension. There is no rebound and no guarding.       Has a moderate sized umbilical hernia that is reducible but tender. This has gotten larger since her last exam 4 months ago  Musculoskeletal:       There is a small firm tender mass on the dorsal right wrist           Assessment & Plan:  Refer to Hand Surgery for the cyst, and to Surgery for the hernia. Get an A1c today

## 2011-02-10 ENCOUNTER — Encounter: Payer: Self-pay | Admitting: Family Medicine

## 2011-02-10 MED ORDER — GLIPIZIDE 10 MG PO TABS: 10.0000 mg | ORAL_TABLET | Freq: Two times a day (BID) | ORAL | Status: DC

## 2011-02-10 NOTE — Progress Notes (Signed)
Quick Note:  Left voice message for pt, put a copy of results in mail and sent script e-scribe. ______

## 2011-02-10 NOTE — Progress Notes (Signed)
Addended by: Aniceto Boss A on: 02/10/2011 03:49 PM   Modules accepted: Orders

## 2011-02-17 ENCOUNTER — Ambulatory Visit: Payer: 59 | Admitting: Family Medicine

## 2011-02-17 ENCOUNTER — Telehealth: Payer: Self-pay | Admitting: Family Medicine

## 2011-02-17 NOTE — Telephone Encounter (Signed)
Has?about rx that was recently rx'd . Please advise of side effects. Wants Nettie Elm to return call. Thanks.

## 2011-02-20 NOTE — Telephone Encounter (Signed)
Left voice message for pt to return my call.

## 2011-02-24 ENCOUNTER — Ambulatory Visit (INDEPENDENT_AMBULATORY_CARE_PROVIDER_SITE_OTHER): Payer: 59 | Admitting: Surgery

## 2011-02-24 ENCOUNTER — Encounter (INDEPENDENT_AMBULATORY_CARE_PROVIDER_SITE_OTHER): Payer: Self-pay | Admitting: Surgery

## 2011-02-24 ENCOUNTER — Telehealth: Payer: Self-pay | Admitting: Family Medicine

## 2011-02-24 VITALS — BP 118/74 | HR 91 | Temp 98.5°F | Ht 62.5 in | Wt 187.8 lb

## 2011-02-24 DIAGNOSIS — K429 Umbilical hernia without obstruction or gangrene: Secondary | ICD-10-CM

## 2011-02-24 DIAGNOSIS — G8929 Other chronic pain: Secondary | ICD-10-CM

## 2011-02-24 DIAGNOSIS — M545 Low back pain, unspecified: Secondary | ICD-10-CM

## 2011-02-24 HISTORY — DX: Other chronic pain: G89.29

## 2011-02-24 HISTORY — DX: Low back pain, unspecified: M54.50

## 2011-02-24 NOTE — Telephone Encounter (Signed)
Pt called and is req call back from nurse, after 10am today. Pt has some questions re: some medications and about the doctor appt that she had today at [facility]. Pt is on her way to appt now and wants a call back afterward.

## 2011-02-24 NOTE — Progress Notes (Signed)
Patient ID: [name], female   DOB: 1960/09/08, 51 y.o.   MRN: [id number]  Chief Complaint  Patient presents with  . Pre-op Exam    eval umb hernia    HPI Morgana Rowley is a 51 y.o. female.   HPIThe patient presents at the request of Dr. Daisey Must due to a bulge around her umbilicus. It has been present for many years. He is getting larger. It is causing pain especially with cough. No nausea or vomiting. No redness. The pain is dull in nature. It is exacerbated by activity.  Past Medical History  Diagnosis Date  . Gestational diabetes     resolved  . Euthyroid goiter     sees Dr. Rocky Crafts. Had normal labs, [place], and biopsies in 2009  . Nasal congestion   . Change in voice   . Cough   . Wheezing     Past Surgical History  Procedure Date  . Tonsillectomy     Family History  Problem Relation Age of Onset  . Alcohol abuse      family history  . Cancer Father     prostate  . Heart disease Father   . Diabetes Maternal Aunt     Social History History  Substance Use Topics  . Smoking status: Former Smoker    Quit date: 02/24/1995  . Smokeless tobacco: Never Used  . Alcohol Use: 2.0 oz/week    4 drink(s) per week    Allergies  Allergen Reactions  . Pseudoephedrine     Current Outpatient Prescriptions  Medication Sig Dispense Refill  . fluticasone (FLONASE) 50 MCG/ACT nasal spray Place 2 sprays into the nose as needed.      . metFORMIN (GLUCOPHAGE) 1000 MG tablet Take 1 tablet (1,000 mg total) by mouth 2 (two) times daily with a meal.  180 tablet  9    Review of Systems Review of Systems  Constitutional: Negative.   HENT: Negative.   Eyes: Negative.   Respiratory: Negative.   Cardiovascular: Negative.   Gastrointestinal: Negative.   Genitourinary: Negative.   Musculoskeletal: Negative.   Neurological: Negative.   Hematological: Negative.   Psychiatric/Behavioral: Negative.     Blood pressure 118/74, pulse 91, temperature 98.5 F (36.9 C), temperature  source Temporal, height 5' 2.5" (1.588 m), weight 187 lb 12.8 oz (85.186 kg), SpO2 98.00%.  Physical Exam Physical Exam  Constitutional: She appears well-developed and well-nourished.  HENT:  Head: Normocephalic and atraumatic.  Eyes: EOM are normal. Pupils are equal, round, and reactive to light.  Neck: Normal range of motion. Neck supple.  Cardiovascular: Normal rate and regular rhythm.   Pulmonary/Chest: Effort normal and breath sounds normal.  Abdominal: Soft. Bowel sounds are normal.    Skin: Skin is warm and dry.  Psychiatric: She has a normal mood and affect. Her speech is normal and behavior is normal. Judgment and thought content normal. Cognition and memory are normal.    Data Reviewed   Assessment    Umbilical hernia reducible symptomatic    Plan    The patient wishes repair of her hernia. I discussed options of surgical repair and observation. Risks, benefits and alternative therapies discussed. She agrees to proceed.The risk of hernia repair include bleeding,  Infection,   Recurrence of the hernia,  Mesh use, chronic pain,  Organ injury,  Bowel injury,  Bladder injury,   nerve injury with numbness around the incision,  Death,  and worsening of preexisting  medical problems.  The alternatives to surgery have been  discussed as well..  Long term expectations of both operative and non operative treatments have been discussed.   The patient agrees to proceed.       Kyesha Balla A. 02/24/2011, 9:45 AM

## 2011-02-24 NOTE — Patient Instructions (Signed)
Hernia °A hernia occurs when an internal organ pushes out through a weak spot in the abdominal wall. Hernias most commonly occur in the groin and around the navel. Hernias often can be pushed back into place (reduced). Most hernias tend to get worse over time. Some abdominal hernias can get stuck in the opening (irreducible or incarcerated hernia) and cannot be reduced. An irreducible abdominal hernia which is tightly squeezed into the opening is at risk for impaired blood supply (strangulated hernia). A strangulated hernia is a medical emergency. Because of the risk for an irreducible or strangulated hernia, surgery may be recommended to repair a hernia. °CAUSES  °· Heavy lifting.  °· Prolonged coughing.  °· Straining to have a bowel movement.  °· A cut (incision) made during an abdominal surgery.  °HOME CARE INSTRUCTIONS  °· Bed rest is not required. You may continue your normal activities.  °· Avoid lifting more than 10 pounds (4.5 kg) or straining.  °· Cough gently. If you are a smoker it is best to stop. Even the best hernia repair can break down with the continual strain of coughing. Even if you do not have your hernia repaired, a cough will continue to aggravate the problem.  °· Do not wear anything tight over your hernia. Do not try to keep it in with an outside bandage or truss. These can damage abdominal contents if they are trapped within the hernia sac.  °· Eat a normal diet.  °· Avoid constipation. Straining over long periods of time will increase hernia size and encourage breakdown of repairs. If you cannot do this with diet alone, stool softeners may be used.  °SEEK IMMEDIATE MEDICAL CARE IF:  °· You have a fever.  °· You develop increasing abdominal pain.  °· You feel nauseous or vomit.  °· Your hernia is stuck outside the abdomen, looks discolored, feels hard, or is tender.  °· You have any changes in your bowel habits or in the hernia that are unusual for you.  °· You have increased pain or  swelling around the hernia.  °· You cannot push the hernia back in place by applying gentle pressure while lying down.  °MAKE SURE YOU:  °· Understand these instructions.  °· Will watch your condition.  °· Will get help right away if you are not doing well or get worse.  °Document Released: 01/09/2005 Document Revised: 09/21/2010 Document Reviewed: 08/29/2007 °ExitCare® Patient Information ©2012 [employer]. °

## 2011-02-27 ENCOUNTER — Telehealth: Payer: Self-pay | Admitting: Family Medicine

## 2011-02-27 MED ORDER — AZITHROMYCIN 250 MG PO TABS: ORAL_TABLET | ORAL | Status: AC

## 2011-02-27 NOTE — Telephone Encounter (Signed)
Just stay on the Metformin for now. Exercise and watch a strict diet

## 2011-02-27 NOTE — Telephone Encounter (Signed)
Requesting another refill of a Zpak---[facility]. Thanks. Wants a call back.

## 2011-02-27 NOTE — Telephone Encounter (Signed)
Call in a Zpack  ?

## 2011-02-27 NOTE — Telephone Encounter (Signed)
I spoke with pt and she did not start on the Glipizide due to the possible side effects. She is still taking the Metormin. Do you want to try her on another medication or just stay on the Metformin?

## 2011-02-27 NOTE — Telephone Encounter (Signed)
Spoke with pt

## 2011-02-27 NOTE — Telephone Encounter (Signed)
Script sent e-scribe and I spoke with pt. 

## 2011-02-28 ENCOUNTER — Telehealth (INDEPENDENT_AMBULATORY_CARE_PROVIDER_SITE_OTHER): Payer: Self-pay | Admitting: General Surgery

## 2011-02-28 NOTE — Telephone Encounter (Signed)
Message copied by Lannie Fields on Tue Feb 28, 2011  4:53 PM ------      Message from: Jari Sportsman      Created: Fri Feb 24, 2011  1:46 PM      Regarding: surgery orders       Contact: [phone]       Pt called left message stated she will have her surgery on march 22 and her OV pre op on March 1.  You can call and leave her a message at [phone] if you need her       I do not find any orders on this patient for surgery or see where she is scheduled on March 22 w Dr Luisa Hart       Tina [name]

## 2011-02-28 NOTE — Telephone Encounter (Signed)
I SPOKE WITH MS Betten AND MADE A PRE-OP APPT WITH DR. Luisa Hart ON 03-24-11/ HE WILL ENTER ORDERS THEN/ GY

## 2011-02-28 NOTE — Telephone Encounter (Signed)
I RECEIVED A STAFF MESSAGE FROM [facility] THAT MS Bonczek THOUGHT SHE WAS SCHEDULED FOR/ NO APPTS FOUND IN SCHEDULING CALENDAR/ I LEFT A VOICE MESSAGE FOR MS Felker TO CALL ME ON 02-24-11 AND 02-28-11/GY

## 2011-03-08 ENCOUNTER — Ambulatory Visit (INDEPENDENT_AMBULATORY_CARE_PROVIDER_SITE_OTHER): Payer: 59 | Admitting: Family Medicine

## 2011-03-08 ENCOUNTER — Encounter: Payer: Self-pay | Admitting: Family Medicine

## 2011-03-08 VITALS — BP 112/74 | HR 91 | Temp 98.4°F | Wt 185.0 lb

## 2011-03-08 DIAGNOSIS — J40 Bronchitis, not specified as acute or chronic: Secondary | ICD-10-CM

## 2011-03-08 MED ORDER — DOXYCYCLINE HYCLATE 100 MG PO CAPS: 100.0000 mg | ORAL_CAPSULE | Freq: Two times a day (BID) | ORAL | Status: AC

## 2011-03-08 MED ORDER — HYDROCODONE-HOMATROPINE 5-1.5 MG/5ML PO SYRP: 5.0000 mL | ORAL_SOLUTION | ORAL | Status: AC | PRN

## 2011-03-08 MED ORDER — PREDNISONE (PAK) 10 MG PO TABS: 10.0000 mg | ORAL_TABLET | Freq: Every day | ORAL | Status: DC

## 2011-03-08 NOTE — Progress Notes (Signed)
  Subjective:    Patient ID: Tina [name], female    DOB: 02-27-1960, 51 y.o.   MRN: [id number]  HPI Here for 3 weeks of hard dry coughing which makes her ribs ache and her head hurt. No fever or ST. Drinking fluids and taking cough syrup. She took a Zpack with no response.    Review of Systems  Constitutional: Negative.   HENT: Negative.   Eyes: Negative.   Respiratory: Positive for cough and shortness of breath. Negative for wheezing.        Objective:   Physical Exam  Constitutional: She appears well-developed and well-nourished.  HENT:  Right Ear: External ear normal.  Left Ear: External ear normal.  Nose: Nose normal.  Mouth/Throat: Oropharynx is clear and moist. No oropharyngeal exudate.  Eyes: Conjunctivae are normal.  Neck: No thyromegaly present.  Pulmonary/Chest: Effort normal and breath sounds normal. No respiratory distress. She has no wheezes. She has no rales.  Lymphadenopathy:    She has no cervical adenopathy.          Assessment & Plan:  Try Doxycycline and a steroid dose pack.

## 2011-03-14 ENCOUNTER — Telehealth (INDEPENDENT_AMBULATORY_CARE_PROVIDER_SITE_OTHER): Payer: Self-pay | Admitting: Surgery

## 2011-03-14 NOTE — Telephone Encounter (Signed)
I LEFT A MESSAGE ON PT'S PHONE RETURNING HER CALL ABOUT APPT CHANGE/GY

## 2011-03-16 NOTE — Telephone Encounter (Signed)
I SPOKE WITH MS Mcdill THIS AM RE HER QUESTION RE APPOINTMENT DATE AND SURGERY/GY

## 2011-03-18 ENCOUNTER — Emergency Department (INDEPENDENT_AMBULATORY_CARE_PROVIDER_SITE_OTHER): Payer: 59

## 2011-03-18 ENCOUNTER — Encounter (HOSPITAL_COMMUNITY): Payer: Self-pay | Admitting: *Deleted

## 2011-03-18 ENCOUNTER — Emergency Department (INDEPENDENT_AMBULATORY_CARE_PROVIDER_SITE_OTHER)
Admission: EM | Admit: 2011-03-18 | Discharge: 2011-03-18 | Disposition: A | Discharge status: Home / Self Care | Payer: 59 | Source: Home / Self Care | Attending: Emergency Medicine | Admitting: Emergency Medicine

## 2011-03-18 DIAGNOSIS — S139XXA Sprain of joints and ligaments of unspecified parts of neck, initial encounter: Secondary | ICD-10-CM

## 2011-03-18 DIAGNOSIS — S335XXA Sprain of ligaments of lumbar spine, initial encounter: Secondary | ICD-10-CM

## 2011-03-18 DIAGNOSIS — S39012A Strain of muscle, fascia and tendon of lower back, initial encounter: Secondary | ICD-10-CM

## 2011-03-18 DIAGNOSIS — M5430 Sciatica, unspecified side: Secondary | ICD-10-CM

## 2011-03-18 DIAGNOSIS — S161XXA Strain of muscle, fascia and tendon at neck level, initial encounter: Secondary | ICD-10-CM

## 2011-03-18 MED ORDER — TRAMADOL HCL 50 MG PO TABS: 100.0000 mg | ORAL_TABLET | Freq: Three times a day (TID) | ORAL | Status: AC | PRN

## 2011-03-18 MED ORDER — METHOCARBAMOL 500 MG PO TABS: 500.0000 mg | ORAL_TABLET | Freq: Three times a day (TID) | ORAL | Status: AC

## 2011-03-18 MED ORDER — DICLOFENAC SODIUM 75 MG PO TBEC: 75.0000 mg | DELAYED_RELEASE_TABLET | Freq: Two times a day (BID) | ORAL | Status: DC
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
## 2011-03-18 NOTE — Discharge Instructions (Signed)
Back Exercises Back exercises help treat and prevent back injuries. The goal of back exercises is to increase the strength of your abdominal and back muscles and the flexibility of your back. These exercises should be started when you no longer have back pain. Back exercises include:  Pelvic Tilt. Lie on your back with your knees bent. Tilt your pelvis until the lower part of your back is against the floor. Hold this position 5 to 10 sec and repeat 5 to 10 times.   Knee to Chest. Pull first 1 knee up against your chest and hold for 20 to 30 seconds, repeat this with the other knee, and then both knees. This may be done with the other leg straight or bent, whichever feels better.   Sit-Ups or Curl-Ups. Bend your knees 90 degrees. Start with tilting your pelvis, and do a partial, slow sit-up, lifting your trunk only 30 to 45 degrees off the floor. Take at least 2 to 3 seconds for each sit-up. Do not do sit-ups with your knees out straight. If partial sit-ups are difficult, simply do the above but with only tightening your abdominal muscles and holding it as directed.   Hip-Lift. Lie on your back with your knees flexed 90 degrees. Push down with your feet and shoulders as you raise your hips a couple inches off the floor; hold for 10 seconds, repeat 5 to 10 times.   Back arches. Lie on your stomach, propping yourself up on bent elbows. Slowly press on your hands, causing an arch in your low back. Repeat 3 to 5 times. Any initial stiffness and discomfort should lessen with repetition over time.   Shoulder-Lifts. Lie face down with arms beside your body. Keep hips and torso pressed to floor as you slowly lift your head and shoulders off the floor.  Do not overdo your exercises, especially in the beginning. Exercises may cause you some mild back discomfort which lasts for a few minutes; however, if the pain is more severe, or lasts for more than 15 minutes, do not continue exercises until you see your  caregiver. Improvement with exercise therapy for back problems is slow.  See your caregivers for assistance with developing a proper back exercise program. Document Released: 02/17/2004 Document Revised: 09/07/2010 Document Reviewed: 01/09/2005 ExitCare Patient Information 2012 [employer]. 

## 2011-03-24 ENCOUNTER — Encounter (INDEPENDENT_AMBULATORY_CARE_PROVIDER_SITE_OTHER): Payer: 59 | Admitting: Surgery

## 2011-03-27 ENCOUNTER — Ambulatory Visit (INDEPENDENT_AMBULATORY_CARE_PROVIDER_SITE_OTHER): Payer: 59 | Admitting: Surgery

## 2011-03-27 ENCOUNTER — Encounter (INDEPENDENT_AMBULATORY_CARE_PROVIDER_SITE_OTHER): Payer: Self-pay | Admitting: Surgery

## 2011-03-27 VITALS — BP 128/82 | HR 92 | Temp 97.4°F | Resp 16 | Ht 62.5 in | Wt 182.8 lb

## 2011-03-27 DIAGNOSIS — K429 Umbilical hernia without obstruction or gangrene: Secondary | ICD-10-CM

## 2011-03-27 NOTE — Progress Notes (Signed)
Patient ID: Tina [name], female   DOB: 10/11/1960, 51 y.o.   MRN: [id number]  Chief Complaint  Patient presents with  . Follow-up    discuss and sched sx for umb hernia    HPI Tina [name] is a 51 y.o. female.   HPIThe patient presents at the request of Dr. Frey due to a bulge around her umbilicus. It has been present for many years. He is getting larger. It is causing pain especially with cough. No nausea or vomiting. No redness. The pain is dull in nature. It is exacerbated by activity.  Past Medical History  Diagnosis Date  . Gestational diabetes     resolved  . Euthyroid goiter     sees Dr. Altimer. Had normal labs, US, and biopsies in 2009  . Umbilical hernia     Past Surgical History  Procedure Date  . Tonsillectomy     Family History  Problem Relation Age of Onset  . Alcohol abuse      family history  . Cancer Father     prostate  . Heart disease Father   . Diabetes Maternal Aunt   . Heart disease Maternal Grandfather     Social History History  Substance Use Topics  . Smoking status: Never Smoker   . Smokeless tobacco: Never Used  . Alcohol Use: 2.0 oz/week    4 drink(s) per week    Allergies  Allergen Reactions  . Pseudoephedrine     Makes heart race on large doses    Current Outpatient Prescriptions  Medication Sig Dispense Refill  . diclofenac (VOLTAREN) 75 MG EC tablet Take 1 tablet (75 mg total) by mouth 2 (two) times daily.  20 tablet  0  . fluticasone (FLONASE) 50 MCG/ACT nasal spray Place 2 sprays into the nose as needed.      . metFORMIN (GLUCOPHAGE) 1000 MG tablet Take 1 tablet (1,000 mg total) by mouth 2 (two) times daily with a meal.  180 tablet  9  . methocarbamol (ROBAXIN) 500 MG tablet Take 1 tablet (500 mg total) by mouth 3 (three) times daily.  30 tablet  0  . traMADol (ULTRAM) 50 MG tablet Take 2 tablets (100 mg total) by mouth every 8 (eight) hours as needed for pain.  30 tablet  0    Review of Systems Review of Systems    Constitutional: Negative.   HENT: Negative.   Eyes: Negative.   Respiratory: Negative.   Cardiovascular: Negative.   Gastrointestinal: Negative.   Genitourinary: Negative.   Musculoskeletal: Negative.   Neurological: Negative.   Hematological: Negative.   Psychiatric/Behavioral: Negative.     Blood pressure 128/82, pulse 92, temperature 97.4 F (36.3 C), temperature source Temporal, resp. rate 16, height 5' 2.5" (1.588 m), weight 182 lb 12.8 oz (82.918 kg).  Physical Exam Physical Exam  Constitutional: She appears well-developed and well-nourished.  HENT:  Head: Normocephalic and atraumatic.  Eyes: EOM are normal. Pupils are equal, round, and reactive to light.  Neck: Normal range of motion. Neck supple.  Cardiovascular: Normal rate and regular rhythm.   Pulmonary/Chest: Effort normal and breath sounds normal.  Abdominal: Soft. Bowel sounds are normal.    Skin: Skin is warm and dry.  Psychiatric: She has a normal mood and affect. Her speech is normal and behavior is normal. Judgment and thought content normal. Cognition and memory are normal.    Data Reviewed   Assessment    Umbilical hernia reducible symptomatic    Plan      The patient wishes repair of her hernia. I discussed options of surgical repair and observation. Risks, benefits and alternative therapies discussed. She agrees to proceed.The risk of hernia repair include bleeding,  Infection,   Recurrence of the hernia,  Mesh use, chronic pain,  Organ injury,  Bowel injury,  Bladder injury,   nerve injury with numbness around the incision,  Death,  and worsening of preexisting  medical problems.  The alternatives to surgery have been discussed as well..  Long term expectations of both operative and non operative treatments have been discussed.   The patient agrees to proceed.       Tina [name] A. 03/27/2011, 9:28 AM    

## 2011-03-27 NOTE — Patient Instructions (Signed)
You will be scheduled for surgery.

## 2011-03-30 ENCOUNTER — Encounter: Payer: Self-pay | Admitting: Family Medicine

## 2011-03-30 ENCOUNTER — Ambulatory Visit (INDEPENDENT_AMBULATORY_CARE_PROVIDER_SITE_OTHER): Payer: 59 | Admitting: Family Medicine

## 2011-03-30 VITALS — BP 112/64 | HR 106 | Temp 98.7°F | Wt 180.0 lb

## 2011-03-30 DIAGNOSIS — S39012A Strain of muscle, fascia and tendon of lower back, initial encounter: Secondary | ICD-10-CM

## 2011-03-30 DIAGNOSIS — S8000XA Contusion of unspecified knee, initial encounter: Secondary | ICD-10-CM

## 2011-03-30 DIAGNOSIS — S66919A Strain of unspecified muscle, fascia and tendon at wrist and hand level, unspecified hand, initial encounter: Secondary | ICD-10-CM

## 2011-03-30 DIAGNOSIS — S335XXA Sprain of ligaments of lumbar spine, initial encounter: Secondary | ICD-10-CM

## 2011-03-30 DIAGNOSIS — S139XXA Sprain of joints and ligaments of unspecified parts of neck, initial encounter: Secondary | ICD-10-CM

## 2011-03-30 DIAGNOSIS — S63509A Unspecified sprain of unspecified wrist, initial encounter: Secondary | ICD-10-CM

## 2011-03-30 DIAGNOSIS — S161XXA Strain of muscle, fascia and tendon at neck level, initial encounter: Secondary | ICD-10-CM

## 2011-03-30 MED ORDER — METHOCARBAMOL 500 MG PO TABS: 500.0000 mg | ORAL_TABLET | Freq: Four times a day (QID) | ORAL | Status: DC | PRN

## 2011-03-30 MED ORDER — DICLOFENAC SODIUM 75 MG PO TBEC: 75.0000 mg | DELAYED_RELEASE_TABLET | Freq: Two times a day (BID) | ORAL | Status: DC

## 2011-03-30 MED ORDER — TRAMADOL HCL 50 MG PO TABS: 100.0000 mg | ORAL_TABLET | Freq: Four times a day (QID) | ORAL | Status: DC | PRN
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
## 2011-04-03 ENCOUNTER — Ambulatory Visit: Payer: 59 | Attending: Family Medicine

## 2011-04-03 ENCOUNTER — Telehealth: Payer: Self-pay | Admitting: Family Medicine

## 2011-04-03 DIAGNOSIS — M545 Low back pain, unspecified: Secondary | ICD-10-CM | POA: Insufficient documentation

## 2011-04-03 DIAGNOSIS — M542 Cervicalgia: Secondary | ICD-10-CM | POA: Insufficient documentation

## 2011-04-03 DIAGNOSIS — IMO0001 Reserved for inherently not codable concepts without codable children: Secondary | ICD-10-CM | POA: Insufficient documentation

## 2011-04-03 DIAGNOSIS — R5381 Other malaise: Secondary | ICD-10-CM | POA: Insufficient documentation

## 2011-04-03 NOTE — Telephone Encounter (Signed)
They should be contacting her today
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
## 2011-04-04 ENCOUNTER — Encounter (INDEPENDENT_AMBULATORY_CARE_PROVIDER_SITE_OTHER): Payer: Self-pay

## 2011-04-05 ENCOUNTER — Telehealth: Payer: Self-pay | Admitting: Family Medicine

## 2011-04-05 ENCOUNTER — Ambulatory Visit: Payer: 59 | Admitting: Physical Therapy

## 2011-04-05 NOTE — Telephone Encounter (Signed)
Patient has questions regarding her Diclofenac medication.  Patient is having surgery next week - needs clarification on other medications as well.  Please call her as soon as possible.

## 2011-04-06 ENCOUNTER — Telehealth: Payer: Self-pay | Admitting: Family Medicine

## 2011-04-06 ENCOUNTER — Encounter (HOSPITAL_BASED_OUTPATIENT_CLINIC_OR_DEPARTMENT_OTHER): Payer: Self-pay | Admitting: *Deleted
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
## 2011-04-06 NOTE — Telephone Encounter (Signed)
First, she can use the Diclofenac until 5 days before the surgery next week. After that she can use Tylenol or muscle relaxers (not NSAIDs). As for the PT, she does not need PT for her wrists or hands or feet or knees. These injuries were quite minor compared to her neck and back injuries.

## 2011-04-06 NOTE — Telephone Encounter (Signed)
I left a detailed message on pt's cell phone. See next telephone documentation note.

## 2011-04-06 NOTE — Progress Notes (Signed)
To come in for labs,cxr,ekg  

## 2011-04-06 NOTE — Telephone Encounter (Signed)
I left the below message on pt's cell phone.

## 2011-04-07 ENCOUNTER — Other Ambulatory Visit: Payer: Self-pay

## 2011-04-07 ENCOUNTER — Encounter (HOSPITAL_BASED_OUTPATIENT_CLINIC_OR_DEPARTMENT_OTHER)
Admission: RE | Admit: 2011-04-07 | Discharge: 2011-04-07 | Disposition: A | Discharge status: Home / Self Care | Payer: 59 | Source: Ambulatory Visit | Attending: Surgery | Admitting: Surgery

## 2011-04-07 ENCOUNTER — Ambulatory Visit
Admission: RE | Admit: 2011-04-07 | Discharge: 2011-04-07 | Disposition: A | Discharge status: Home / Self Care | Payer: 59 | Source: Ambulatory Visit | Attending: Surgery | Admitting: Surgery

## 2011-04-07 ENCOUNTER — Ambulatory Visit: Payer: 59 | Admitting: Physical Therapy

## 2011-04-07 ENCOUNTER — Telehealth: Payer: Self-pay | Admitting: Family Medicine

## 2011-04-07 LAB — DIFFERENTIAL
Basophils Absolute: 0.1 10*3/uL (ref 0.0–0.1)
Eosinophils Absolute: 0.3 10*3/uL (ref 0.0–0.7)
Eosinophils Relative: 4 % (ref 0–5)

## 2011-04-07 LAB — COMPREHENSIVE METABOLIC PANEL
ALT: 37 U/L — ABNORMAL HIGH (ref 0–35)
Alkaline Phosphatase: 78 U/L (ref 39–117)
CO2: 28 mEq/L (ref 19–32)
GFR calc Af Amer: >90 mL/min (ref >90)
GFR calc non Af Amer: >90 mL/min (ref >90)
Glucose, Bld: 167 mg/dL — ABNORMAL HIGH (ref 70–99)
Potassium: 4.6 mEq/L (ref 3.5–5.1)
Sodium: 138 mEq/L (ref 135–145)
Total Bilirubin: 0.3 mg/dL (ref 0.3–1.2)

## 2011-04-07 LAB — CBC
Hemoglobin: 14 g/dL (ref 12.0–15.0)
RBC: 4.74 MIL/uL (ref 3.87–5.11)

## 2011-04-07 NOTE — Telephone Encounter (Signed)
Pt would like a order for knees, hands and feet  to be evaluated alone with back etc for physical therapy at [facility] [place] location. Pt has appt on 04-10-2011.

## 2011-04-07 NOTE — Telephone Encounter (Signed)
Request was denied, see previous phone note from 04/06/11 and I left a voice message on that date.

## 2011-04-10 ENCOUNTER — Ambulatory Visit: Payer: 59

## 2011-04-10 NOTE — Telephone Encounter (Signed)
Left a voice message stating referral requesting was denied for outpatient therapy

## 2011-04-11 ENCOUNTER — Ambulatory Visit (HOSPITAL_BASED_OUTPATIENT_CLINIC_OR_DEPARTMENT_OTHER)
Admission: RE | Admit: 2011-04-11 | Discharge: 2011-04-11 | Disposition: A | Discharge status: Home / Self Care | Payer: 59 | Source: Ambulatory Visit | Attending: Surgery | Admitting: Surgery

## 2011-04-11 ENCOUNTER — Encounter (HOSPITAL_BASED_OUTPATIENT_CLINIC_OR_DEPARTMENT_OTHER): Payer: Self-pay | Admitting: Anesthesiology

## 2011-04-11 ENCOUNTER — Encounter (HOSPITAL_BASED_OUTPATIENT_CLINIC_OR_DEPARTMENT_OTHER)
Admission: RE | Disposition: A | Discharge status: Home / Self Care | Payer: Self-pay | Source: Ambulatory Visit | Attending: Surgery

## 2011-04-11 ENCOUNTER — Encounter (HOSPITAL_BASED_OUTPATIENT_CLINIC_OR_DEPARTMENT_OTHER): Payer: Self-pay | Admitting: *Deleted

## 2011-04-11 ENCOUNTER — Ambulatory Visit (HOSPITAL_BASED_OUTPATIENT_CLINIC_OR_DEPARTMENT_OTHER): Payer: 59 | Admitting: Anesthesiology

## 2011-04-11 ENCOUNTER — Other Ambulatory Visit (INDEPENDENT_AMBULATORY_CARE_PROVIDER_SITE_OTHER): Payer: Self-pay

## 2011-04-11 DIAGNOSIS — E049 Nontoxic goiter, unspecified: Secondary | ICD-10-CM

## 2011-04-11 DIAGNOSIS — K429 Umbilical hernia without obstruction or gangrene: Secondary | ICD-10-CM | POA: Insufficient documentation

## 2011-04-11 DIAGNOSIS — Z01810 Encounter for preprocedural cardiovascular examination: Secondary | ICD-10-CM | POA: Insufficient documentation

## 2011-04-11 DIAGNOSIS — J209 Acute bronchitis, unspecified: Secondary | ICD-10-CM

## 2011-04-11 DIAGNOSIS — Z01812 Encounter for preprocedural laboratory examination: Secondary | ICD-10-CM | POA: Insufficient documentation

## 2011-04-11 DIAGNOSIS — H6980 Other specified disorders of Eustachian tube, unspecified ear: Secondary | ICD-10-CM

## 2011-04-11 DIAGNOSIS — J019 Acute sinusitis, unspecified: Secondary | ICD-10-CM

## 2011-04-11 DIAGNOSIS — E119 Type 2 diabetes mellitus without complications: Secondary | ICD-10-CM

## 2011-04-11 HISTORY — PX: UMBILICAL HERNIA REPAIR: SHX196

## 2011-04-11 LAB — GLUCOSE, CAPILLARY: Glucose-Capillary: 118 mg/dL — ABNORMAL HIGH (ref 70–99)

## 2011-04-11 SURGERY — REPAIR, HERNIA, UMBILICAL, ADULT
Anesthesia: General | Site: Abdomen | Wound class: Clean

## 2011-04-11 MED ORDER — HYDROCODONE-ACETAMINOPHEN 10-325 MG PO TABS: 1.0000 | ORAL_TABLET | Freq: Four times a day (QID) | ORAL | Status: DC | PRN

## 2011-04-11 MED ORDER — CEFAZOLIN SODIUM-DEXTROSE 2-3 GM-% IV SOLR
2.0000 g | INTRAVENOUS | Status: AC
  Administered 2011-04-11: 2 g via INTRAVENOUS

## 2011-04-11 MED ORDER — DROPERIDOL 2.5 MG/ML IJ SOLN
INTRAMUSCULAR | Status: DC | PRN
  Administered 2011-04-11: 0.625 mg via INTRAVENOUS

## 2011-04-11 MED ORDER — LACTATED RINGERS IV SOLN
INTRAVENOUS | Status: DC
Start: 2011-04-11 — End: 2011-04-11
  Administered 2011-04-11: 10 mL/h via INTRAVENOUS
  Administered 2011-04-11 (×2): via INTRAVENOUS

## 2011-04-11 MED ORDER — MORPHINE SULFATE 2 MG/ML IJ SOLN: 0.0500 mg/kg | INTRAMUSCULAR | Status: DC | PRN

## 2011-04-11 MED ORDER — ONDANSETRON HCL 4 MG/2ML IJ SOLN: 4.0000 mg | Freq: Once | INTRAMUSCULAR | Status: DC | PRN

## 2011-04-11 MED ORDER — ROCURONIUM BROMIDE 100 MG/10ML IV SOLN
INTRAVENOUS | Status: DC | PRN
  Administered 2011-04-11: 30 mg via INTRAVENOUS

## 2011-04-11 MED ORDER — BUPIVACAINE-EPINEPHRINE 0.25% -1:200000 IJ SOLN
INTRAMUSCULAR | Status: DC | PRN
  Administered 2011-04-11: 10 mL

## 2011-04-11 MED ORDER — PROPOFOL 10 MG/ML IV EMUL
INTRAVENOUS | Status: DC | PRN
  Administered 2011-04-11: 200 mg via INTRAVENOUS

## 2011-04-11 MED ORDER — HYDROMORPHONE HCL PF 1 MG/ML IJ SOLN
0.2500 mg | INTRAMUSCULAR | Status: DC | PRN
  Administered 2011-04-11 (×2): 0.5 mg via INTRAVENOUS

## 2011-04-11 MED ORDER — ONDANSETRON HCL 4 MG/2ML IJ SOLN
INTRAMUSCULAR | Status: DC | PRN
  Administered 2011-04-11: 4 mg via INTRAVENOUS

## 2011-04-11 MED ORDER — MIDAZOLAM HCL 5 MG/5ML IJ SOLN
INTRAMUSCULAR | Status: DC | PRN
  Administered 2011-04-11: 1 mg via INTRAVENOUS
  Administered 2011-04-11: 2 mg via INTRAVENOUS

## 2011-04-11 MED ORDER — FENTANYL CITRATE 0.05 MG/ML IJ SOLN
INTRAMUSCULAR | Status: DC | PRN
  Administered 2011-04-11: 50 ug via INTRAVENOUS
  Administered 2011-04-11: 100 ug via INTRAVENOUS
  Administered 2011-04-11: 50 ug via INTRAVENOUS

## 2011-04-11 MED ORDER — NEOSTIGMINE METHYLSULFATE 1 MG/ML IJ SOLN
INTRAMUSCULAR | Status: DC | PRN
  Administered 2011-04-11: 2 mg via INTRAVENOUS

## 2011-04-11 MED ORDER — DEXAMETHASONE SODIUM PHOSPHATE 4 MG/ML IJ SOLN
INTRAMUSCULAR | Status: DC | PRN
  Administered 2011-04-11: 4 mg via INTRAVENOUS

## 2011-04-11 MED ORDER — GLYCOPYRROLATE 0.2 MG/ML IJ SOLN
INTRAMUSCULAR | Status: DC | PRN
  Administered 2011-04-11: .4 mg via INTRAVENOUS

## 2011-04-11 MED ORDER — HYDROCODONE-ACETAMINOPHEN 5-325 MG PO TABS
1.0000 | ORAL_TABLET | Freq: Once | ORAL | Status: AC
  Administered 2011-04-11: 1 via ORAL

## 2011-04-11 SURGICAL SUPPLY — 56 items
ADH SKN CLS APL DERMABOND .7 (GAUZE/BANDAGES/DRESSINGS) ×1
APL SKNCLS STERI-STRIP NONHPOA (GAUZE/BANDAGES/DRESSINGS)
BENZOIN TINCTURE PRP APPL 2/3 (GAUZE/BANDAGES/DRESSINGS) IMPLANT
BLADE SURG 10 STRL SS (BLADE) ×2 IMPLANT
BLADE SURG 15 STRL LF DISP TIS (BLADE) ×1 IMPLANT
BLADE SURG 15 STRL SS (BLADE) ×2
BLADE SURG ROTATE 9660 (MISCELLANEOUS) IMPLANT
CANISTER SUCTION 1200CC (MISCELLANEOUS) ×2 IMPLANT
CHLORAPREP W/TINT 26ML (MISCELLANEOUS) ×2 IMPLANT
CLEANER CAUTERY TIP 5X5 PAD (MISCELLANEOUS) ×1 IMPLANT
CLOTH BEACON ORANGE TIMEOUT ST (SAFETY) ×2 IMPLANT
COVER MAYO STAND STRL (DRAPES) ×2 IMPLANT
COVER TABLE BACK 60X90 (DRAPES) ×2 IMPLANT
DECANTER SPIKE VIAL GLASS SM (MISCELLANEOUS) IMPLANT
DERMABOND ADVANCED (GAUZE/BANDAGES/DRESSINGS) ×1
DERMABOND ADVANCED .7 DNX12 (GAUZE/BANDAGES/DRESSINGS) ×1 IMPLANT
DRAPE LAPAROTOMY TRNSV 102X78 (DRAPE) ×2 IMPLANT
DRAPE UTILITY XL STRL (DRAPES) ×2 IMPLANT
DRSG TEGADERM 4X4.75 (GAUZE/BANDAGES/DRESSINGS) IMPLANT
ELECT REM PT RETURN 9FT ADLT (ELECTROSURGICAL) ×2
ELECTRODE REM PT RTRN 9FT ADLT (ELECTROSURGICAL) ×1 IMPLANT
GLOVE BIOGEL PI IND STRL 7.0 (GLOVE) IMPLANT
GLOVE BIOGEL PI IND STRL 8 (GLOVE) ×1 IMPLANT
GLOVE BIOGEL PI INDICATOR 7.0 (GLOVE) ×1
GLOVE BIOGEL PI INDICATOR 8 (GLOVE) ×1
GLOVE ECLIPSE 6.5 STRL STRAW (GLOVE) ×1 IMPLANT
GLOVE ECLIPSE 8.0 STRL XLNG CF (GLOVE) ×2 IMPLANT
GLOVE SKINSENSE NS SZ7.0 (GLOVE) ×1
GLOVE SKINSENSE STRL SZ7.0 (GLOVE) IMPLANT
GOWN PREVENTION PLUS XLARGE (GOWN DISPOSABLE) ×4 IMPLANT
NDL HYPO 25X1 1.5 SAFETY (NEEDLE) ×1 IMPLANT
NEEDLE HYPO 22GX1.5 SAFETY (NEEDLE) IMPLANT
NEEDLE HYPO 25X1 1.5 SAFETY (NEEDLE) ×2 IMPLANT
NS IRRIG 1000ML POUR BTL (IV SOLUTION) ×1 IMPLANT
PACK BASIN DAY SURGERY FS (CUSTOM PROCEDURE TRAY) ×2 IMPLANT
PAD CLEANER CAUTERY TIP 5X5 (MISCELLANEOUS) ×1
PENCIL BUTTON HOLSTER BLD 10FT (ELECTRODE) ×2 IMPLANT
SLEEVE SCD COMPRESS KNEE MED (MISCELLANEOUS) ×2 IMPLANT
STAPLER VISISTAT 35W (STAPLE) IMPLANT
STRIP CLOSURE SKIN 1/2X4 (GAUZE/BANDAGES/DRESSINGS) IMPLANT
SUT MON AB 4-0 PC3 18 (SUTURE) ×2 IMPLANT
SUT NOVA NAB DX-16 0-1 5-0 T12 (SUTURE) ×3 IMPLANT
SUT NOVA NAB GS-22 2 0 T19 (SUTURE) IMPLANT
SUT PROLENE 0 CT 1 30 (SUTURE) IMPLANT
SUT SILK 3 0 SH 30 (SUTURE) IMPLANT
SUT VIC AB 2-0 SH 27 (SUTURE) ×2
SUT VIC AB 2-0 SH 27XBRD (SUTURE) ×1 IMPLANT
SUT VIC AB 3-0 SH 27 (SUTURE)
SUT VIC AB 3-0 SH 27X BRD (SUTURE) IMPLANT
SYR CONTROL 10ML LL (SYRINGE) ×2 IMPLANT
TOWEL OR 17X24 6PK STRL BLUE (TOWEL DISPOSABLE) ×2 IMPLANT
TOWEL OR NON WOVEN STRL DISP B (DISPOSABLE) ×2 IMPLANT
TUBE CONNECTING 20X1/4 (TUBING) ×2 IMPLANT
Ventralex ST Hernia Patch 6.4cm/2.5" ×1 IMPLANT
WATER STERILE IRR 1000ML POUR (IV SOLUTION) ×1 IMPLANT
YANKAUER SUCT BULB TIP NO VENT (SUCTIONS) ×2 IMPLANT

## 2011-04-11 NOTE — Discharge Instructions (Signed)
[facility]  [address] [place], [place] [address] [phone]   Post Anesthesia Home Care Instructions  Activity: Get plenty of rest for the remainder of the day. A responsible adult should stay with you for 24 hours following the procedure.  For the next 24 hours, DO NOT: -Drive a car -Advertising copywriter -Drink alcoholic beverages -Take any medication unless instructed by your physician -Make any legal decisions or sign important papers.  Meals: Start with liquid foods such as gelatin or soup. Progress to regular foods as tolerated. Avoid greasy, spicy, heavy foods. If nausea and/or vomiting occur, drink only clear liquids until the nausea and/or vomiting subsides. Call your physician if vomiting continues.  Special Instructions/Symptoms: Your throat may feel dry or sore from the anesthesia or the breathing tube placed in your throat during surgery. If this causes discomfort, gargle with warm salt water. The discomfort should disappear within 24 hours.     Ok to shower. Dermabond will fall off in 3 weeks.   Ice packs as needed.   Walking and stairs OK No lifting more than 15 lbs for 4 weeks PT OK for neck and shoulder PT for back on hold for 3 weeks If any drainage noted,  Place cotton ball over incision and secure with band aid. If redness or pus draining from wound call office for appointment If constipated  Take laxative of choice Anesthesiologist noted enlarged thyroid gland during intubation.  I will obtain some outpatient testing to evaluate this. Return in 3 weeks  Call office for appointment.

## 2011-04-11 NOTE — H&P (View-Only) (Signed)
Patient ID: Tina [name], female   DOB: September 01, 1960, 51 y.o.   MRN: [id number]  Chief Complaint  Patient presents with  . Follow-up    discuss and sched sx for umb hernia    HPI Tina [name] is [name] 51 y.o. female.   HPIThe patient presents at the request of Dr. Daisey Must due to [name] bulge around her umbilicus. It has been present for many years. He is getting larger. It is causing pain especially with cough. No nausea or vomiting. No redness. The pain is dull in nature. It is exacerbated by activity.  Past Medical History  Diagnosis Date  . Gestational diabetes     resolved  . Euthyroid goiter     sees Dr. Rocky Crafts. Had normal labs, [place], and biopsies in 2009  . Umbilical hernia     Past Surgical History  Procedure Date  . Tonsillectomy     Family History  Problem Relation Age of Onset  . Alcohol abuse      family history  . Cancer Father     prostate  . Heart disease Father   . Diabetes Maternal Aunt   . Heart disease Maternal Grandfather     Social History History  Substance Use Topics  . Smoking status: Never Smoker   . Smokeless tobacco: Never Used  . Alcohol Use: 2.0 oz/week    4 drink(s) per week    Allergies  Allergen Reactions  . Pseudoephedrine     Makes heart race on large doses    Current Outpatient Prescriptions  Medication Sig Dispense Refill  . diclofenac (VOLTAREN) 75 MG EC tablet Take 1 tablet (75 mg total) by mouth 2 (two) times daily.  20 tablet  0  . fluticasone (FLONASE) 50 MCG/ACT nasal spray Place 2 sprays into the nose as needed.      . metFORMIN (GLUCOPHAGE) 1000 MG tablet Take 1 tablet (1,000 mg total) by mouth 2 (two) times daily with [name] meal.  180 tablet  9  . methocarbamol (ROBAXIN) 500 MG tablet Take 1 tablet (500 mg total) by mouth 3 (three) times daily.  30 tablet  0  . traMADol (ULTRAM) 50 MG tablet Take 2 tablets (100 mg total) by mouth every 8 (eight) hours as needed for pain.  30 tablet  0    Review of Systems Review of Systems    Constitutional: Negative.   HENT: Negative.   Eyes: Negative.   Respiratory: Negative.   Cardiovascular: Negative.   Gastrointestinal: Negative.   Genitourinary: Negative.   Musculoskeletal: Negative.   Neurological: Negative.   Hematological: Negative.   Psychiatric/Behavioral: Negative.     Blood pressure 128/82, pulse 92, temperature 97.4 F (36.3 C), temperature source Temporal, resp. rate 16, height 5' 2.5" (1.588 m), weight 182 lb 12.8 oz (82.918 kg).  Physical Exam Physical Exam  Constitutional: She appears well-developed and well-nourished.  HENT:  Head: Normocephalic and atraumatic.  Eyes: EOM are normal. Pupils are equal, round, and reactive to light.  Neck: Normal range of motion. Neck supple.  Cardiovascular: Normal rate and regular rhythm.   Pulmonary/Chest: Effort normal and breath sounds normal.  Abdominal: Soft. Bowel sounds are normal.    Skin: Skin is warm and dry.  Psychiatric: She has [name] normal mood and affect. Her speech is normal and behavior is normal. Judgment and thought content normal. Cognition and memory are normal.    Data Reviewed   Assessment    Umbilical hernia reducible symptomatic    Plan  The patient wishes repair of her hernia. I discussed options of surgical repair and observation. Risks, benefits and alternative therapies discussed. She agrees to proceed.The risk of hernia repair include bleeding,  Infection,   Recurrence of the hernia,  Mesh use, chronic pain,  Organ injury,  Bowel injury,  Bladder injury,   nerve injury with numbness around the incision,  Death,  and worsening of preexisting  medical problems.  The alternatives to surgery have been discussed as well..  Long term expectations of both operative and non operative treatments have been discussed.   The patient agrees to proceed.       Tina [name]. 03/27/2011, 9:28 AM

## 2011-04-11 NOTE — Progress Notes (Signed)
Dr Ivin Booty at bedside and explain to pt that she had a small airway probably due to thyroid and the way she is made, he will send her a letter. Pt c/o sore throat  And Dr Ivin Booty said yes because of her airway difficulty -- used LMA.

## 2011-04-11 NOTE — Anesthesia Postprocedure Evaluation (Signed)
  Anesthesia Post-op Note  Patient: Tina [name]  Procedure(s) Performed: Procedure(s) (LRB): HERNIA REPAIR UMBILICAL ADULT (N/A) INSERTION OF MESH (N/A)  Patient Location: PACU  Anesthesia Type: General  Level of Consciousness: awake, alert  and oriented  Airway and Oxygen Therapy: Patient Spontanous Breathing  Post-op Pain: mild  Post-op Assessment: Post-op Vital signs reviewed, Patient's Cardiovascular Status Stable, Respiratory Function Stable, Patent Airway, No signs of Nausea or vomiting and Pain level controlled  Post-op Vital Signs: Reviewed and stable  Complications: No apparent anesthesia complications

## 2011-04-11 NOTE — Op Note (Signed)
Preoperative diagnosis: Umbilical hernia  Postoperative diagnosis: Incarcerated umbilical hernia measuring 3 cm x 3 cm  Procedure: Repair of umbilical hernia With 6.4 cm x 6.4 cm Ventralex mesh ST  Surgeon: Harriette Bouillon M.D.  Anesthesia: Gen.  With LMA and 0.25% bupivicaine with epi  EBL: Less than 10 cc  Specimens: None  Drains: None  IV fluids: 1000 cc crystalloid  Indications for procedure: The patient presents symptomatic umbilical hernia. Operative techniques and nonoperative managements discussed preoperatively. Risks, benefits and alternative therapies discussed with the patient. Risk of bleeding, infection, use of mesh, adhesion formation, bowel injury, abdominal wall injury, injury to other intra-abdominal structures, recurrence of hernia, and the need for other surgical procedures discussed as well as complications of blood clots, heart attack, stroke, and death. The use of mesh discussed. She agreed to proceed.  Description of procedure: The patient was met in the holding area and questions are answered with the patient and her husband. She was taken back to the operating room. She received 1 g of Ancef. She was placed supine on the operating room table and general anesthesia was initiated. The abdomen was prepped and draped in a sterile fashion. Anesthesia had difficulty with her airway in place an LMA. She had a significant amount of compression from her thyroid. She has no history of thyroid problems. Once he was secure it was patient and worked well. The periumbilical region was prepped and draped in a sterile fashion. Timeout was done. Incision was made above the umbilicus. There is a large hernia sac that contained omentum and pre-peritoneal fat. I opened the sac and reduced contents back in the abdominal cavity. The fascial edges were cleared. The defect measured 3 cm x 3 cm. She had very poor fascia which was also very thin. A 6.4 cm x 6.4 cm Ventralex ST mesh was used. It  was secured to the undersurface of the fascia with 20 Novafil. 6 sutures were used. The fascia was closed with 20 Novafil over the mesh. The umbilicus was secured to the fascia with 2-0 Vicryl. The skin was closed with 4-0 Monocryl. Dermabond applied. Total of 10 cc of 0.25% Sensorcaine with epinephrine used for local. All final counts sponge, needles and instruments found to be correct. The patient was in awoke taken to recovery in satisfactory condition. All final counts are found to be correct.

## 2011-04-11 NOTE — Anesthesia Procedure Notes (Signed)
Procedure Name: LMA Insertion Date/Time: 04/11/2011 10:35 AM Performed by: Zenia Resides D Pre-anesthesia Checklist: Patient identified, Emergency Drugs available, Suction available, Patient being monitored and Timeout performed Patient Re-evaluated:Patient Re-evaluated prior to inductionOxygen Delivery Method: Circle System Utilized Preoxygenation: Pre-oxygenation with 100% oxygen Intubation Type: IV induction Ventilation: Mask ventilation without difficulty LMA: LMA with gastric port inserted LMA Size: 4.0 Grade View: Grade III Number of attempts: 1 Placement Confirmation: positive ETCO2 Tube secured with: Tape Dental Injury: Teeth and Oropharynx as per pre-operative assessment

## 2011-04-11 NOTE — Interval H&P Note (Signed)
History and Physical Interval Note:  04/11/2011 10:05 AM  Tina [name]  has presented today for surgery, with the diagnosis of umbilical hernia   The various methods of treatment have been discussed with the patient and family. After consideration of risks, benefits and other options for treatment, the patient has consented to  Procedure(s) (LRB): HERNIA REPAIR UMBILICAL ADULT (N/A) INSERTION OF MESH (N/A) as a surgical intervention .  The patients' history has been reviewed, patient examined, no change in status, stable for surgery.  I have reviewed the patients' chart and labs.  Questions were answered to the patient's satisfaction.     Ineta Sinning A.

## 2011-04-11 NOTE — Transfer of Care (Signed)
Immediate Anesthesia Transfer of Care Note  Patient: Tina [name]  Procedure(s) Performed: Procedure(s) (LRB): HERNIA REPAIR UMBILICAL ADULT (N/A) INSERTION OF MESH (N/A)  Patient Location: PACU  Anesthesia Type: General  Level of Consciousness: awake  Airway & Oxygen Therapy: Patient Spontanous Breathing and Patient connected to face mask oxygen  Post-op Assessment: Report given to PACU RN and Post -op Vital signs reviewed and stable  Post vital signs: Reviewed and stable  Complications: No apparent anesthesia complications

## 2011-04-11 NOTE — Anesthesia Preprocedure Evaluation (Signed)
Anesthesia Evaluation  Patient identified by MRN, date of birth, ID band Patient awake    Reviewed: Allergy & Precautions, H&P , NPO status , Patient's Chart, lab work & pertinent test results  Airway Mallampati: I TM Distance: >3 FB Neck ROM: Full    Dental  (+) Teeth Intact   Pulmonary  breath sounds clear to auscultation        Cardiovascular Rhythm:Regular Rate:Normal     Neuro/Psych    GI/Hepatic   Endo/Other    Renal/GU      Musculoskeletal   Abdominal   Peds  Hematology   Anesthesia Other Findings   Reproductive/Obstetrics                           Anesthesia Physical Anesthesia Plan  ASA: II  Anesthesia Plan: General   Post-op Pain Management:    Induction: Intravenous  Airway Management Planned: LMA  Additional Equipment:   Intra-op Plan:   Post-operative Plan:   Informed Consent: I have reviewed the patients History and Physical, chart, labs and discussed the procedure including the risks, benefits and alternatives for the proposed anesthesia with the patient or authorized representative who has indicated his/her understanding and acceptance.   Dental advisory given  Plan Discussed with: CRNA, Anesthesiologist and Surgeon  Anesthesia Plan Comments:         Anesthesia Quick Evaluation

## 2011-04-13 ENCOUNTER — Encounter (HOSPITAL_BASED_OUTPATIENT_CLINIC_OR_DEPARTMENT_OTHER): Payer: Self-pay | Admitting: Surgery

## 2011-04-13 ENCOUNTER — Other Ambulatory Visit: Payer: 59

## 2011-04-20 ENCOUNTER — Telehealth (INDEPENDENT_AMBULATORY_CARE_PROVIDER_SITE_OTHER): Payer: Self-pay | Admitting: General Surgery

## 2011-04-20 NOTE — Telephone Encounter (Signed)
PT CALLED RE PAIN MEDICATION REILL AFTER SURGERY ON 04-11-11/ FIRST REFILL/ STANDARD POST-OP PAIN MEDICATION HYDROCODONE 5/325MG / #30 CALLED TO [facility]/ [place]/ [phone]/ PT AWARE/GY

## 2011-04-28 ENCOUNTER — Ambulatory Visit
Admission: RE | Admit: 2011-04-28 | Discharge: 2011-04-28 | Disposition: A | Discharge status: Home / Self Care | Payer: 59 | Source: Ambulatory Visit | Attending: Surgery | Admitting: Surgery

## 2011-04-28 DIAGNOSIS — E049 Nontoxic goiter, unspecified: Secondary | ICD-10-CM

## 2011-05-01 ENCOUNTER — Ambulatory Visit (INDEPENDENT_AMBULATORY_CARE_PROVIDER_SITE_OTHER): Payer: 59 | Admitting: Surgery

## 2011-05-01 ENCOUNTER — Encounter (INDEPENDENT_AMBULATORY_CARE_PROVIDER_SITE_OTHER): Payer: Self-pay | Admitting: Surgery

## 2011-05-01 VITALS — BP 112/66 | HR 105 | Temp 98.0°F | Ht 62.5 in | Wt 182.4 lb

## 2011-05-01 DIAGNOSIS — Z9889 Other specified postprocedural states: Secondary | ICD-10-CM

## 2011-05-01 NOTE — Progress Notes (Signed)
Patient returns after repair of umbilical hernia with mesh. During intubation anesthesiologist had difficulties due to her large thyroid goiter. I repeated her thyroid ultrasound which is unchanged compared to 2009 which was worked up by her endocrinologist. She has a large and dominant left thyroid lobe mass which was biopsied in 2009 and was benign. This is unchanged on her most recent ultrasound. She denies any stridor, dysphasia or neck pain.  Exam: Incision to superior aspect of umbilicus healing well. No redness or hernia recurrence noted.  Neck: Goiter noted.  Impression: Status post repair umbilical hernia with mesh doing well  Thyroid goiter stable by ultrasound previously worked up in 2009 by endocrinologist  Plan: Return to full activity in 3 weeks. Return to clinic as needed. No further workup of thyroid recommended since stable 2009.

## 2011-05-01 NOTE — Patient Instructions (Signed)
Full activity in 3 weeks.  Thyroid appears stable.

## 2011-05-02 ENCOUNTER — Ambulatory Visit: Payer: 59 | Attending: Family Medicine

## 2011-05-02 DIAGNOSIS — M25539 Pain in unspecified wrist: Secondary | ICD-10-CM | POA: Insufficient documentation

## 2011-05-02 DIAGNOSIS — IMO0001 Reserved for inherently not codable concepts without codable children: Secondary | ICD-10-CM | POA: Insufficient documentation

## 2011-05-02 DIAGNOSIS — R5381 Other malaise: Secondary | ICD-10-CM | POA: Insufficient documentation

## 2011-05-02 DIAGNOSIS — M25569 Pain in unspecified knee: Secondary | ICD-10-CM | POA: Insufficient documentation

## 2011-05-03 ENCOUNTER — Ambulatory Visit: Payer: 59 | Admitting: Physical Therapy

## 2011-05-05 ENCOUNTER — Ambulatory Visit: Payer: 59 | Admitting: Physical Therapy

## 2011-05-08 ENCOUNTER — Ambulatory Visit: Payer: 59 | Admitting: Physical Therapy

## 2011-05-10 ENCOUNTER — Ambulatory Visit: Payer: 59 | Admitting: Physical Therapy

## 2011-05-12 ENCOUNTER — Ambulatory Visit: Payer: 59 | Admitting: Physical Therapy

## 2011-05-15 ENCOUNTER — Ambulatory Visit: Payer: 59

## 2011-05-17 ENCOUNTER — Ambulatory Visit: Payer: 59 | Admitting: Physical Therapy

## 2011-05-18 ENCOUNTER — Ambulatory Visit: Payer: 59

## 2011-05-23 ENCOUNTER — Ambulatory Visit: Payer: 59

## 2011-05-24 ENCOUNTER — Ambulatory Visit: Payer: 59 | Attending: Family Medicine | Admitting: Physical Therapy

## 2011-05-24 DIAGNOSIS — M25569 Pain in unspecified knee: Secondary | ICD-10-CM | POA: Insufficient documentation

## 2011-05-24 DIAGNOSIS — IMO0001 Reserved for inherently not codable concepts without codable children: Secondary | ICD-10-CM | POA: Insufficient documentation

## 2011-05-24 DIAGNOSIS — M25539 Pain in unspecified wrist: Secondary | ICD-10-CM | POA: Insufficient documentation

## 2011-05-24 DIAGNOSIS — R5381 Other malaise: Secondary | ICD-10-CM | POA: Insufficient documentation

## 2011-05-25 ENCOUNTER — Ambulatory Visit: Payer: 59 | Admitting: Physical Therapy

## 2011-05-26 ENCOUNTER — Encounter: Payer: 59 | Admitting: Physical Therapy

## 2011-05-31 ENCOUNTER — Ambulatory Visit: Payer: 59 | Admitting: Physical Therapy

## 2011-06-02 ENCOUNTER — Ambulatory Visit: Payer: 59

## 2011-06-06 ENCOUNTER — Ambulatory Visit: Payer: 59 | Admitting: Physical Therapy

## 2011-06-09 ENCOUNTER — Ambulatory Visit: Payer: 59 | Admitting: Physical Therapy

## 2011-06-12 ENCOUNTER — Ambulatory Visit: Payer: 59 | Admitting: Physical Therapy

## 2011-06-14 ENCOUNTER — Encounter: Payer: 59 | Admitting: Physical Therapy

## 2011-06-15 ENCOUNTER — Ambulatory Visit: Payer: 59 | Admitting: Physical Therapy

## 2011-06-20 ENCOUNTER — Ambulatory Visit: Payer: 59 | Admitting: Physical Therapy

## 2011-06-22 ENCOUNTER — Ambulatory Visit: Payer: 59 | Admitting: Physical Therapy

## 2011-09-27 ENCOUNTER — Telehealth: Payer: Self-pay | Admitting: Family Medicine

## 2011-09-27 NOTE — Telephone Encounter (Signed)
Pt is requesting cpx before end of sept. Can I create 30 min slot?

## 2011-09-27 NOTE — Telephone Encounter (Signed)
Yes, per Dr. Fry. 

## 2011-09-27 NOTE — Telephone Encounter (Signed)
lmom for pt to cb

## 2011-09-29 NOTE — Telephone Encounter (Signed)
lmom for pt to cb

## 2011-10-02 NOTE — Telephone Encounter (Signed)
lmom for pt to call back

## 2011-10-05 NOTE — Telephone Encounter (Signed)
lmom for pt to call back

## 2011-12-06 ENCOUNTER — Other Ambulatory Visit: Payer: Self-pay | Admitting: Family Medicine

## 2012-01-01 ENCOUNTER — Encounter (INDEPENDENT_AMBULATORY_CARE_PROVIDER_SITE_OTHER): Payer: Self-pay | Admitting: General Surgery

## 2012-01-02 ENCOUNTER — Ambulatory Visit (INDEPENDENT_AMBULATORY_CARE_PROVIDER_SITE_OTHER): Payer: 59 | Admitting: General Surgery

## 2012-01-02 ENCOUNTER — Encounter (INDEPENDENT_AMBULATORY_CARE_PROVIDER_SITE_OTHER): Payer: Self-pay | Admitting: General Surgery

## 2012-01-02 VITALS — BP 116/61 | HR 84 | Temp 98.6°F | Resp 16 | Ht 62.5 in | Wt 188.2 lb

## 2012-01-02 DIAGNOSIS — E041 Nontoxic single thyroid nodule: Secondary | ICD-10-CM

## 2012-01-02 NOTE — Progress Notes (Signed)
Patient ID: Tina [name], female   DOB: 1960-09-10, 51 y.o.   MRN: [id number]  Chief Complaint  Patient presents with  . Thyroid Problem    HPI Tina [name] is a 51 y.o. female.  From Dr. Leslie Dales for an evaluation of a left thyroid nodule. Patient is been diagnosed with a previous left thyroid nodule back in 2009. Patient had this previously ultrasound which revealed a large simple 7.1 x 3.8 x 7.6 cm solid nodule left thyroid lobe.  Also this patient was lost to followup. Patient subsequently underwent a umbilical hernia repair at which time intubation was difficult. Physical this patient underwent an MRI of her C-spine secondary to see which revealed the large left thyroid nodule at this time the nodule measured 9.5 x 5.1 x 7.6 cm, and extended into the thoracic inlet. Patient states that she's noticed some swelling changes and dysphasia, and as well as the husband noticing some sleep apnea while sleeping. Patient also notes some sluggishness in her activity levels. HPI  Past Medical History  Diagnosis Date  . Gestational diabetes     resolved  . Euthyroid goiter     sees Dr. Rocky Crafts. Had normal labs, [place], and biopsies in 2009  . Umbilical hernia   . Vitamin D deficiency disease     Past Surgical History  Procedure Date  . Tonsillectomy   . Wisdom tooth extraction   . Colonoscopy   . Umbilical hernia repair 04/11/2011    Procedure: HERNIA REPAIR UMBILICAL ADULT;  Surgeon: Clovis Pu. Cornett, MD;  Location: [facility];  Service: General;  Laterality: N/A;  repair umbilical hernia with mesh   . Hernia repair [id number]    umbilical hernia    Family History  Problem Relation Age of Onset  . Alcohol abuse      family history  . Cancer Father     prostate  . Heart disease Father   . Diabetes Maternal Aunt   . Heart disease Maternal Grandfather     Social History History  Substance Use Topics  . Smoking status: Never Smoker   . Smokeless tobacco: Never  Used  . Alcohol Use: 2.0 oz/week    4 drink(s) per week    Allergies  Allergen Reactions  . Pseudoephedrine     Makes heart race on large doses    Current Outpatient Prescriptions  Medication Sig Dispense Refill  . acetaminophen (TYLENOL) 500 MG tablet Take 500 mg by mouth every 6 (six) hours as needed.      . Blood Glucose Monitoring Suppl (ACCU-CHEK ADVANTAGE DIABETES) kit by Other route. Use as instructed      . fluticasone (FLONASE) 50 MCG/ACT nasal spray Place 2 sprays into the nose daily.      . metFORMIN (GLUCOPHAGE) 1000 MG tablet TAKE 1 TABLET (1,000 MG TOTAL) BY MOUTH 2 (TWO) TIMES DAILY WITH A MEAL.  180 tablet  1  . Methylsulfonylmethane (MSM) 1000 MG TABS Take by mouth.      . mometasone (ELOCON) 0.1 % cream Apply topically daily.      . naproxen sodium (ANAPROX) 220 MG tablet Take 220 mg by mouth 2 (two) times daily with a meal.        Review of Systems Review of Systems  Constitutional: Negative.   HENT: Negative.   Respiratory: Positive for choking.   Gastrointestinal: Negative.   Musculoskeletal: Negative.     Blood pressure 116/61, pulse 84, temperature 98.6 F (37 C), temperature source Temporal, resp.  rate 16, height 5' 2.5" (1.588 m), weight 188 lb 3.2 oz (85.367 kg).  Physical Exam Physical Exam  Constitutional: She is oriented to person, place, and time. She appears well-developed and well-nourished.  HENT:  Head: Normocephalic and atraumatic.  Eyes: Conjunctivae normal and EOM are normal. Pupils are equal, round, and reactive to light.  Neck: Normal range of motion. Neck supple. Tracheal deviation (Right) present. Mass and thyromegaly present.    Cardiovascular: Normal rate, regular rhythm and normal heart sounds.   Pulmonary/Chest: Effort normal and breath sounds normal. No stridor.  Abdominal: Bowel sounds are normal.  Musculoskeletal: Normal range of motion.  Neurological: She is alert and oriented to person, place, and time.    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    Plan    1. Long discussion with the patient which detailed her wishes for excision of thyroid nodule secondary to continued growth. I recommended that secondary to her symptoms and trachea deviation to proceed with excision of the left thyroid lobectomy as well as possible total thyroidectomy. We discussed the likelihood for the difficulty of intubation operation. We also discussed the possible need for a mini sternotomy and the need for possible cardiothoracic surgery to be available for surgery. The patient will like to have this done this year if possible.  2.All risks and benefits were discussed with the patient, to generally include infection, bleeding, damage to surrounding structures, and recurrence. Alternatives were offered and described.  All questions were answered and the patient voiced understanding of the procedure and wishes to proceed at this point.        Marigene Ehlers., Porcha Deblanc 01/02/2012, 3:00 PM

## 2012-01-05 ENCOUNTER — Encounter (HOSPITAL_COMMUNITY): Payer: Self-pay | Admitting: Pharmacy Technician

## 2012-01-05 ENCOUNTER — Other Ambulatory Visit: Payer: Self-pay | Admitting: Otolaryngology

## 2012-01-05 ENCOUNTER — Telehealth (INDEPENDENT_AMBULATORY_CARE_PROVIDER_SITE_OTHER): Payer: Self-pay | Admitting: General Surgery

## 2012-01-05 NOTE — Telephone Encounter (Signed)
Pt called to come in and pick up the MRI disc and written report; located all in the "accordian file" and placed at the front desk.

## 2012-01-09 ENCOUNTER — Encounter (INDEPENDENT_AMBULATORY_CARE_PROVIDER_SITE_OTHER): Payer: Self-pay

## 2012-01-09 NOTE — Pre-Procedure Instructions (Signed)
20 AUTUMM HATTERY  01/09/2012   Your procedure is scheduled on:  Tuesday, December 24th.  Report to [facility] at 11:00AM.  Call this number if you have problems the morning of surgery: [phone]   Remember:Nothing to eat or drink after Midnight.   Take these medicines the morning of surgery with A SIP OF WATER: May use Nasal spray and Eye drops.    Do not wear jewelry, make-up or nail polish.  Do not wear lotions, powders, or perfumes. You may wear deodorant.  Do not shave 48 hours prior to surgery. Men may shave face and neck.  Do not bring valuables to the hospital.  Contacts, dentures or bridgework may not be worn into surgery.  Leave suitcase in the car. After surgery it may be brought to your room.  For patients admitted to the hospital, checkout time is 11:00 AM the day of discharge.   Patients discharged the day of surgery will not be allowed to drive home.  Name and phone number of your driver: Herman Mell, husband    Special Instructions: Shower using CHG 2 nights before surgery and the night before surgery.  If you shower the day of surgery use CHG.  Use special wash - you have one bottle of CHG for all showers.  You should use approximately 1/3 of the bottle for each shower.   Please read over the following fact sheets that you were given: Pain Booklet, Coughing and Deep Breathing and Surgical Site Infection Prevention

## 2012-01-10 ENCOUNTER — Encounter (HOSPITAL_COMMUNITY): Payer: Self-pay

## 2012-01-10 ENCOUNTER — Encounter (HOSPITAL_COMMUNITY)
Admission: RE | Admit: 2012-01-10 | Discharge: 2012-01-10 | Disposition: A | Discharge status: Home / Self Care | Payer: 59 | Source: Ambulatory Visit | Attending: Otolaryngology | Admitting: Otolaryngology

## 2012-01-10 HISTORY — DX: Gastro-esophageal reflux disease without esophagitis: K21.9

## 2012-01-10 HISTORY — DX: Other complications of anesthesia, initial encounter: T88.59XA

## 2012-01-10 HISTORY — DX: Adverse effect of unspecified anesthetic, initial encounter: T41.45XA

## 2012-01-10 LAB — CBC
MCHC: 33.7 g/dL (ref 30.0–36.0)
MCV: 85.3 fL (ref 78.0–100.0)
Platelets: 369 10*3/uL (ref 150–400)
RDW: 13.3 % (ref 11.5–15.5)
WBC: 8.6 10*3/uL (ref 4.0–10.5)

## 2012-01-10 LAB — BASIC METABOLIC PANEL
BUN: 9 mg/dL (ref 6–23)
Chloride: 101 mEq/L (ref 96–112)
Creatinine, Ser: 0.69 mg/dL (ref 0.50–1.10)
GFR calc Af Amer: >90 mL/min (ref >90)
GFR calc non Af Amer: >90 mL/min (ref >90)

## 2012-01-10 LAB — SURGICAL PCR SCREEN: MRSA, PCR: NEGATIVE

## 2012-01-10 NOTE — Progress Notes (Signed)
Pt here for PAT appt.  Family MD: Juluis Rainier w/ [facility], [address] [place], [place].  Two obtain consent orders noted.  Pt stated that surgery is being done w/Dr. Suszanne Conners on 01/16/2012.  Surgery canceled w/ Dr. Derrell Lolling.  Dr. Avel Sensor office called and above confirmed w/ Kim(office manager).

## 2012-01-15 MED ORDER — CHLORHEXIDINE GLUCONATE 4 % EX LIQD: 1.0000 "application " | Freq: Once | CUTANEOUS | Status: DC

## 2012-01-15 MED ORDER — CEFAZOLIN SODIUM-DEXTROSE 2-3 GM-% IV SOLR
2.0000 g | INTRAVENOUS | Status: AC
  Administered 2012-01-16: 2 g via INTRAVENOUS
  Filled 2012-01-15: qty 50

## 2012-01-15 MED ORDER — LACTATED RINGERS IV SOLN
INTRAVENOUS | Status: DC
  Administered 2012-01-16: 11:00:00 via INTRAVENOUS

## 2012-01-16 ENCOUNTER — Encounter (HOSPITAL_COMMUNITY): Payer: Self-pay | Admitting: *Deleted

## 2012-01-16 ENCOUNTER — Encounter (HOSPITAL_COMMUNITY)
Admission: RE | Disposition: A | Discharge status: Home / Self Care | Payer: Self-pay | Source: Ambulatory Visit | Attending: Otolaryngology

## 2012-01-16 ENCOUNTER — Ambulatory Visit (HOSPITAL_COMMUNITY)
Admission: RE | Admit: 2012-01-16 | Discharge: 2012-01-17 | Disposition: A | Discharge status: Home / Self Care | Payer: 59 | Source: Ambulatory Visit | Attending: Otolaryngology | Admitting: Otolaryngology

## 2012-01-16 ENCOUNTER — Encounter (HOSPITAL_COMMUNITY): Payer: Self-pay | Admitting: General Practice

## 2012-01-16 ENCOUNTER — Encounter (HOSPITAL_COMMUNITY): Payer: Self-pay | Admitting: Critical Care Medicine

## 2012-01-16 ENCOUNTER — Ambulatory Visit (HOSPITAL_COMMUNITY): Payer: 59 | Admitting: Critical Care Medicine

## 2012-01-16 DIAGNOSIS — Z01812 Encounter for preprocedural laboratory examination: Secondary | ICD-10-CM | POA: Insufficient documentation

## 2012-01-16 DIAGNOSIS — D34 Benign neoplasm of thyroid gland: Secondary | ICD-10-CM | POA: Insufficient documentation

## 2012-01-16 DIAGNOSIS — E049 Nontoxic goiter, unspecified: Secondary | ICD-10-CM

## 2012-01-16 DIAGNOSIS — R1310 Dysphagia, unspecified: Secondary | ICD-10-CM | POA: Insufficient documentation

## 2012-01-16 DIAGNOSIS — E119 Type 2 diabetes mellitus without complications: Secondary | ICD-10-CM | POA: Insufficient documentation

## 2012-01-16 DIAGNOSIS — J398 Other specified diseases of upper respiratory tract: Secondary | ICD-10-CM | POA: Insufficient documentation

## 2012-01-16 HISTORY — DX: Type 2 diabetes mellitus without complications: E11.9

## 2012-01-16 HISTORY — PX: THYROIDECTOMY, PARTIAL: SHX18

## 2012-01-16 HISTORY — DX: Other chronic pain: G89.29

## 2012-01-16 HISTORY — DX: Unspecified chronic bronchitis: J42

## 2012-01-16 HISTORY — PX: THYROIDECTOMY: SHX17

## 2012-01-16 HISTORY — DX: Low back pain: M54.5

## 2012-01-16 SURGERY — THYROIDECTOMY
Anesthesia: General | Site: Neck | Laterality: Left | Wound class: Clean

## 2012-01-16 MED ORDER — ONDANSETRON HCL 4 MG/2ML IJ SOLN: 4.0000 mg | INTRAMUSCULAR | Status: DC | PRN

## 2012-01-16 MED ORDER — HEMOSTATIC AGENTS (NO CHARGE) OPTIME
TOPICAL | Status: DC | PRN
  Administered 2012-01-16: 1 via TOPICAL

## 2012-01-16 MED ORDER — LIDOCAINE-EPINEPHRINE 1 %-1:100000 IJ SOLN
INTRAMUSCULAR | Status: AC
  Filled 2012-01-16: qty 1

## 2012-01-16 MED ORDER — OXYCODONE HCL 5 MG PO TABS: 5.0000 mg | ORAL_TABLET | Freq: Once | ORAL | Status: DC | PRN

## 2012-01-16 MED ORDER — MIDAZOLAM HCL 5 MG/5ML IJ SOLN
INTRAMUSCULAR | Status: DC | PRN
  Administered 2012-01-16: 2 mg via INTRAVENOUS

## 2012-01-16 MED ORDER — ONDANSETRON HCL 4 MG/2ML IJ SOLN
INTRAMUSCULAR | Status: DC | PRN
  Administered 2012-01-16: 4 mg via INTRAVENOUS

## 2012-01-16 MED ORDER — KETAMINE HCL 100 MG/ML IJ SOLN
INTRAMUSCULAR | Status: AC
  Filled 2012-01-16: qty 1

## 2012-01-16 MED ORDER — LIDOCAINE HCL (CARDIAC) 20 MG/ML IV SOLN
INTRAVENOUS | Status: DC | PRN
  Administered 2012-01-16: 120 mg via INTRATRACHEAL

## 2012-01-16 MED ORDER — ZOLPIDEM TARTRATE 5 MG PO TABS: 5.0000 mg | ORAL_TABLET | Freq: Every evening | ORAL | Status: DC | PRN

## 2012-01-16 MED ORDER — METFORMIN HCL 500 MG PO TABS
1000.0000 mg | ORAL_TABLET | Freq: Two times a day (BID) | ORAL | Status: DC
  Administered 2012-01-16 – 2012-01-17 (×2): 1000 mg via ORAL
  Filled 2012-01-16 (×4): qty 2

## 2012-01-16 MED ORDER — OXYCODONE HCL 5 MG/5ML PO SOLN: 5.0000 mg | Freq: Once | ORAL | Status: DC | PRN

## 2012-01-16 MED ORDER — KETAMINE HCL 10 MG/ML IJ SOLN
INTRAMUSCULAR | Status: DC | PRN
  Administered 2012-01-16 (×3): 10 mg via INTRAVENOUS

## 2012-01-16 MED ORDER — PHENYLEPHRINE HCL 10 MG/ML IJ SOLN
INTRAMUSCULAR | Status: DC | PRN
  Administered 2012-01-16 (×3): 80 ug via INTRAVENOUS
  Administered 2012-01-16 (×2): 40 ug via INTRAVENOUS
  Administered 2012-01-16 (×2): 80 ug via INTRAVENOUS

## 2012-01-16 MED ORDER — FENTANYL CITRATE 0.05 MG/ML IJ SOLN
INTRAMUSCULAR | Status: DC | PRN
  Administered 2012-01-16: 25 ug via INTRAVENOUS
  Administered 2012-01-16: 50 ug via INTRAVENOUS
  Administered 2012-01-16: 100 ug via INTRAVENOUS
  Administered 2012-01-16 (×2): 25 ug via INTRAVENOUS

## 2012-01-16 MED ORDER — OXYCODONE-ACETAMINOPHEN 5-325 MG PO TABS
1.0000 | ORAL_TABLET | ORAL | Status: DC | PRN
  Administered 2012-01-16 – 2012-01-17 (×3): 2 via ORAL
  Filled 2012-01-16 (×3): qty 2

## 2012-01-16 MED ORDER — ONDANSETRON HCL 4 MG PO TABS: 4.0000 mg | ORAL_TABLET | ORAL | Status: DC | PRN

## 2012-01-16 MED ORDER — PROPOFOL 10 MG/ML IV BOLUS
INTRAVENOUS | Status: DC | PRN
  Administered 2012-01-16: 100 mg via INTRAVENOUS

## 2012-01-16 MED ORDER — MORPHINE SULFATE 2 MG/ML IJ SOLN: 2.0000 mg | INTRAMUSCULAR | Status: DC | PRN

## 2012-01-16 MED ORDER — DEXAMETHASONE SODIUM PHOSPHATE 4 MG/ML IJ SOLN
INTRAMUSCULAR | Status: DC | PRN
  Administered 2012-01-16: 4 mg via INTRAVENOUS

## 2012-01-16 MED ORDER — HYDROMORPHONE HCL PF 1 MG/ML IJ SOLN
INTRAMUSCULAR | Status: AC
  Administered 2012-01-16: 0.5 mg via INTRAVENOUS
  Filled 2012-01-16: qty 1

## 2012-01-16 MED ORDER — HYDROMORPHONE HCL PF 1 MG/ML IJ SOLN
0.2500 mg | INTRAMUSCULAR | Status: DC | PRN
  Administered 2012-01-16 (×2): 0.5 mg via INTRAVENOUS

## 2012-01-16 MED ORDER — LIDOCAINE-EPINEPHRINE 1 %-1:100000 IJ SOLN
INTRAMUSCULAR | Status: DC | PRN
  Administered 2012-01-16: 5.5 mL

## 2012-01-16 MED ORDER — GLYCOPYRROLATE 0.2 MG/ML IJ SOLN
INTRAMUSCULAR | Status: DC | PRN
  Administered 2012-01-16: 0.2 mg via INTRAVENOUS

## 2012-01-16 MED ORDER — KCL IN DEXTROSE-NACL 20-5-0.45 MEQ/L-%-% IV SOLN
INTRAVENOUS | Status: DC
  Administered 2012-01-16: 18:00:00 via INTRAVENOUS
  Filled 2012-01-16 (×3): qty 1000

## 2012-01-16 MED ORDER — CEFAZOLIN SODIUM 1-5 GM-% IV SOLN
1.0000 g | Freq: Three times a day (TID) | INTRAVENOUS | Status: DC
  Administered 2012-01-16 – 2012-01-17 (×2): 1 g via INTRAVENOUS
  Filled 2012-01-16 (×3): qty 50

## 2012-01-16 MED ORDER — ARTIFICIAL TEARS OP OINT
TOPICAL_OINTMENT | OPHTHALMIC | Status: DC | PRN
  Administered 2012-01-16: 1 via OPHTHALMIC

## 2012-01-16 MED ORDER — EPHEDRINE SULFATE 50 MG/ML IJ SOLN
INTRAMUSCULAR | Status: DC | PRN
  Administered 2012-01-16: 10 mg via INTRAVENOUS

## 2012-01-16 MED ORDER — 0.9 % SODIUM CHLORIDE (POUR BTL) OPTIME
TOPICAL | Status: DC | PRN
  Administered 2012-01-16: 1000 mL

## 2012-01-16 MED ORDER — LACTATED RINGERS IV SOLN
INTRAVENOUS | Status: DC | PRN
  Administered 2012-01-16 (×2): via INTRAVENOUS

## 2012-01-16 SURGICAL SUPPLY — 47 items
ADH SKN CLS APL DERMABOND .7 (GAUZE/BANDAGES/DRESSINGS) ×1
ATTRACTOMAT 16X20 MAGNETIC DRP (DRAPES) IMPLANT
BLADE SURG 15 STRL LF DISP TIS (BLADE) IMPLANT
BLADE SURG 15 STRL SS (BLADE)
BLADE SURG CLIPPER 3M 9600 (MISCELLANEOUS) IMPLANT
CANISTER SUCTION 2500CC (MISCELLANEOUS) ×2 IMPLANT
CLEANER TIP ELECTROSURG 2X2 (MISCELLANEOUS) ×2 IMPLANT
CLIP TI WIDE RED SMALL 24 (CLIP) IMPLANT
CLOTH BEACON ORANGE TIMEOUT ST (SAFETY) ×2 IMPLANT
CONT SPEC 4OZ CLIKSEAL STRL BL (MISCELLANEOUS) IMPLANT
CORDS BIPOLAR (ELECTRODE) ×2 IMPLANT
COVER SURGICAL LIGHT HANDLE (MISCELLANEOUS) ×2 IMPLANT
DERMABOND ADVANCED (GAUZE/BANDAGES/DRESSINGS) ×1
DERMABOND ADVANCED .7 DNX12 (GAUZE/BANDAGES/DRESSINGS) ×1 IMPLANT
DRAIN JACKSON PRT FLT 10 (DRAIN) ×1 IMPLANT
DRAIN SNY 10X20 3/4 PERF (WOUND CARE) IMPLANT
ELECT COATED BLADE 2.86 ST (ELECTRODE) ×2 IMPLANT
ELECT REM PT RETURN 9FT ADLT (ELECTROSURGICAL) ×2
ELECTRODE REM PT RTRN 9FT ADLT (ELECTROSURGICAL) ×1 IMPLANT
EVACUATOR SILICONE 100CC (DRAIN) ×1 IMPLANT
GAUZE SPONGE 4X4 16PLY XRAY LF (GAUZE/BANDAGES/DRESSINGS) ×12 IMPLANT
GLOVE BIO SURGEON STRL SZ 6.5 (GLOVE) ×1 IMPLANT
GLOVE BIO SURGEON STRL SZ7.5 (GLOVE) ×1 IMPLANT
GLOVE BIO SURGEON STRL SZ8 (GLOVE) ×1 IMPLANT
GLOVE ECLIPSE 7.5 STRL STRAW (GLOVE) ×3 IMPLANT
GOWN STRL NON-REIN LRG LVL3 (GOWN DISPOSABLE) ×4 IMPLANT
KIT BASIN OR (CUSTOM PROCEDURE TRAY) ×2 IMPLANT
KIT ROOM TURNOVER OR (KITS) ×2 IMPLANT
LOCATOR NERVE 3 VOLT (DISPOSABLE) ×1 IMPLANT
MARKER SKIN DUAL TIP RULER LAB (MISCELLANEOUS) ×1 IMPLANT
NS IRRIG 1000ML POUR BTL (IV SOLUTION) ×2 IMPLANT
PAD ARMBOARD 7.5X6 YLW CONV (MISCELLANEOUS) ×4 IMPLANT
PENCIL BUTTON HOLSTER BLD 10FT (ELECTRODE) ×2 IMPLANT
SHEARS HARMONIC 9CM CVD (BLADE) ×1 IMPLANT
SPONGE INTESTINAL PEANUT (DISPOSABLE) ×3 IMPLANT
SPONGE SURGIFOAM ABS GEL 12-7 (HEMOSTASIS) ×1 IMPLANT
STAPLER VISISTAT 35W (STAPLE) ×2 IMPLANT
SUT SILK 2 0 FS (SUTURE) ×2 IMPLANT
SUT SILK 2 0 REEL (SUTURE) IMPLANT
SUT SILK 2 0 SH (SUTURE) ×1 IMPLANT
SUT SILK 3 0 REEL (SUTURE) ×2 IMPLANT
SUT VICRYL 4-0 PS2 18IN ABS (SUTURE) ×2 IMPLANT
TOWEL OR 17X24 6PK STRL BLUE (TOWEL DISPOSABLE) ×3 IMPLANT
TOWEL OR 17X26 10 PK STRL BLUE (TOWEL DISPOSABLE) ×2 IMPLANT
TRAY ENT MC OR (CUSTOM PROCEDURE TRAY) ×2 IMPLANT
TRAY FOLEY CATH 14FRSI W/METER (CATHETERS) IMPLANT
WATER STERILE IRR 1000ML POUR (IV SOLUTION) ×2 IMPLANT

## 2012-01-16 NOTE — Brief Op Note (Signed)
01/16/2012  2:33 PM  PATIENT:  Tina [name]  51 y.o. female  PRE-OPERATIVE DIAGNOSIS:  LEFT THYROID MASS, DYSPHAGIA  POST-OPERATIVE DIAGNOSIS:  LEFT THYROID MASS, DYSPHAGIA  PROCEDURE:  Procedure(s) (LRB) with comments: THYROIDECTOMY (Left)  SURGEON:  Surgeon(s) and Role:    * Darletta Moll, MD - Primary  PHYSICIAN ASSISTANT:   ASSISTANTS: Roma Schanz, PA-C   ANESTHESIA:   general  EBL:  Total I/O In: 1500 [I.V.:1500] Out: 100 [Blood:100]  BLOOD ADMINISTERED:none  DRAINS: (#10) Jackson-Pratt drain(s) with closed bulb suction in the neck   LOCAL MEDICATIONS USED:  LIDOCAINE  and Amount: 5.5 ml  SPECIMEN:  Source of Specimen:  Left thyroid lobe  DISPOSITION OF SPECIMEN:  PATHOLOGY  COUNTS:  YES  TOURNIQUET:  * No tourniquets in log *  DICTATION: .Other Dictation: Dictation Number [id number]  PLAN OF CARE: Admit for overnight observation  PATIENT DISPOSITION:  PACU - hemodynamically stable.   Delay start of Pharmacological VTE agent (>24hrs) due to surgical blood loss or risk of bleeding: no

## 2012-01-16 NOTE — Preoperative (Signed)
Beta Blockers   Reason not to administer Beta Blockers:Not Applicable 

## 2012-01-16 NOTE — Transfer of Care (Signed)
Immediate Anesthesia Transfer of Care Note  Patient: Tina [name]  Procedure(s) Performed: Procedure(s) (LRB) with comments: THYROIDECTOMY (Left)  Patient Location: PACU  Anesthesia Type:General  Level of Consciousness: awake, alert  and oriented  Airway & Oxygen Therapy: Patient Spontanous Breathing and Patient connected to nasal cannula oxygen  Post-op Assessment: Report given to PACU RN, Post -op Vital signs reviewed and stable and Patient moving all extremities X 4  Post vital signs: Reviewed and stable  Complications: No apparent anesthesia complications

## 2012-01-16 NOTE — Anesthesia Procedure Notes (Signed)
Procedure Name: Awake intubation Date/Time: 01/16/2012 12:07 PM Performed by: Elon Alas Pre-anesthesia Checklist: Patient identified, Timeout performed, Emergency Drugs available, Suction available and Patient being monitored Patient Re-evaluated:Patient Re-evaluated prior to inductionOxygen Delivery Method: Circle system utilized Preoxygenation: Pre-oxygenation with 100% oxygen Tube type: Oral Tube size: 6.5 mm Airway Equipment and Method: Fiberoptic brochoscope and Video-laryngoscopy Placement Confirmation: ETT inserted through vocal cords under direct vision,  positive ETCO2 and breath sounds checked- equal and bilateral Secured at: 22 cm Tube secured with: Tape Dental Injury: Teeth and Oropharynx as per pre-operative assessment  Difficulty Due To: Difficulty was anticipated Future Recommendations: Recommend- awake intubation Comments: Awake intubation performed after topicalization with 4% Lidocaine on 4x4. Attempted glide scope but unable to visualize cords. Fiberoptic bronchoscope used next. Vocal cords visualized. 2% Lidocaine sprayed onto and below the cords. 6.5 Parker tube placed into the trachea. +EtCO2. BS=B.

## 2012-01-16 NOTE — H&P (Signed)
  H&P Update  Pt's original H&P dated 01/08/12 reviewed and placed in chart (to be scanned).  I personally examined the patient today.  No change in health. Proceed with left thyroidectomy.

## 2012-01-16 NOTE — Anesthesia Postprocedure Evaluation (Signed)
  Anesthesia Post-op Note  Patient: Tina [name]  Procedure(s) Performed: Procedure(s) (LRB) with comments: THYROIDECTOMY (Left)  Patient Location: PACU  Anesthesia Type:General  Level of Consciousness: awake, alert  and oriented  Airway and Oxygen Therapy: Patient Spontanous Breathing and Patient connected to nasal cannula oxygen  Post-op Pain: mild  Post-op Assessment: Post-op Vital signs reviewed, Patient's Cardiovascular Status Stable, Respiratory Function Stable, Patent Airway and No signs of Nausea or vomiting  Post-op Vital Signs: Reviewed and stable  Complications: No apparent anesthesia complications

## 2012-01-16 NOTE — Anesthesia Preprocedure Evaluation (Addendum)
Anesthesia Evaluation  Patient identified by MRN, date of birth, ID band Patient awake    Reviewed: Allergy & Precautions, H&P , NPO status , Patient's Chart, lab work & pertinent test results  History of Anesthesia Complications (+) DIFFICULT AIRWAY  Airway Mallampati: III TM Distance: >3 FB Neck ROM: Full Positive for:  Tracheal deviation   Dental No notable dental hx. (+) Dental Advisory Given and Teeth Intact   Pulmonary neg pulmonary ROS,  breath sounds clear to auscultation  Pulmonary exam normal       Cardiovascular negative cardio ROS  Rhythm:Regular Rate:Normal     Neuro/Psych negative neurological ROS  negative psych ROS   GI/Hepatic Neg liver ROS, GERD-  Medicated and Controlled,  Endo/Other  diabetes, Type 2, Oral Hypoglycemic AgentsLeft thyroid mass  Renal/GU negative Renal ROS  negative genitourinary   Musculoskeletal   Abdominal   Peds  Hematology negative hematology ROS (+)   Anesthesia Other Findings   Reproductive/Obstetrics negative OB ROS                          Anesthesia Physical Anesthesia Plan  ASA: II  Anesthesia Plan: General   Post-op Pain Management:    Induction: Intravenous  Airway Management Planned: Video Laryngoscope Planned, Oral ETT and Awake Intubation Planned  Additional Equipment:   Intra-op Plan:   Post-operative Plan: Extubation in OR  Informed Consent: I have reviewed the patients History and Physical, chart, labs and discussed the procedure including the risks, benefits and alternatives for the proposed anesthesia with the patient or authorized representative who has indicated his/her understanding and acceptance.   Dental advisory given  Plan Discussed with: Anesthesiologist, Surgeon and CRNA  Anesthesia Plan Comments:        Anesthesia Quick Evaluation

## 2012-01-17 MED ORDER — OMEPRAZOLE 20 MG PO CPDR: 20.0000 mg | DELAYED_RELEASE_CAPSULE | Freq: Two times a day (BID) | ORAL | Status: DC

## 2012-01-17 MED ORDER — OXYCODONE-ACETAMINOPHEN 5-325 MG PO TABS: 1.0000 | ORAL_TABLET | ORAL | Status: DC | PRN

## 2012-01-17 NOTE — Discharge Summary (Signed)
Physician Discharge Summary  Patient ID: Tina [name] MRN: [id number] DOB/AGE: 05/18/1960 51 y.o.  Admit date: 01/16/2012 Discharge date: 01/17/2012  Admission Diagnoses: Left thyroid mass   Discharge Diagnoses: Left thyroid mass Active Problems:  * No active hospital problems. *    Discharged Condition: good  Hospital Course: Pt had an uneventful overnight stay. Pt tolerated po well. No bleeding. No stridor. Voice is strong.   Consults: None  Significant Diagnostic Studies: None  Treatments: surgery: Left thyroidectomy  Discharge Exam: Blood pressure 98/56, pulse 90, temperature 98.3 F (36.8 C), temperature source Oral, resp. rate 15, height 5\' 2"  (1.575 m), weight 85.276 kg (188 lb), SpO2 93.00%. Incision/Wound: Clean, dry, and intact Voice is strong. JP removed.  Disposition: 01-Home or Self Care  Discharge Orders    Future Orders Please Complete By Expires   Diet general      Increase activity slowly          Medication List     As of 01/17/2012 10:57 AM    TAKE these medications         fluticasone 50 MCG/ACT nasal spray   Commonly known as: FLONASE   Place 2 sprays into the nose every morning.      metFORMIN 1000 MG tablet   Commonly known as: GLUCOPHAGE   Take 1,000 mg by mouth 2 (two) times daily with a meal.      omeprazole 20 MG capsule   Commonly known as: PRILOSEC   Take 1 capsule (20 mg total) by mouth 2 (two) times daily.      OVER THE COUNTER MEDICATION   Place 1 drop into both eyes 4 (four) times daily. RENU eye drops for dry eyes      oxyCODONE-acetaminophen 5-325 MG per tablet   Commonly known as: PERCOCET/ROXICET   Take 1-2 tablets by mouth every 4 (four) hours as needed for pain.           Follow-up Information    Follow up with Darletta Moll, MD. On 01/23/2012. (as scheduled)    Contact information:   [address] [place] [address] [phone]          Signed: Darletta Moll 01/17/2012, 10:57  AM

## 2012-01-17 NOTE — Op Note (Signed)
[name], [name] NO.:  [id number]  MEDICAL RECORD NO.:  [id number]  LOCATION:  6N12C                        FACILITY:  [facility]  PHYSICIAN:  Tina Pies, MD            DATE OF BIRTH:  [birth year]  DATE OF PROCEDURE:  01/16/2012 DATE OF DISCHARGE:                              OPERATIVE REPORT   SURGEON:  Tina Pies, MD  PREOPERATIVE DIAGNOSES: 1. Large left thyroid goiter. 2. Dysphagia secondary to thyroid goiter. 3. Severe airway compression secondary to large left thyroid goiter.  POSTOPERATIVE DIAGNOSES: 1. Large left thyroid goiter. 2. Dysphagia secondary to thyroid goiter. 3. Severe airway compression secondary to large left thyroid goiter.  PROCEDURE PERFORMED:  Left thyroidectomy.  ANESTHESIA:  General endotracheal tube anesthesia.  COMPLICATIONS:  None.  ESTIMATED BLOOD LOSS:  100 mL.  INDICATION FOR PROCEDURE:  The patient is a 51 year old female with a history of a large left thyroid goiter.  The size of the left thyroid goiter has increased over time.  She had significant difficulty with endotracheal intubation in March of this year when Dr. Luisa Hart, performed her hernia repair surgery.  Since then, the size of her left thyroid goiter has significantly increased.  She recently had a cervical spine MRI performed.  MRI showed 9 x 7.5 cm left thyroid lobe.  There was severe compression and thyroid displacement of the trachea.  The thyroid also extends into the thoracic inlet.  In addition, the patient also complains of frequent dysphagia.  She complains of constant globus sensation in her throat.  Based on the above findings, the decision was made for the patient to undergo the left thyroidectomy procedure to remove the enlarged thyroid nodule.  The risks, benefits, alternatives, and details of the procedure were discussed with the patient.  Questions were invited and answered.  Informed consent was obtained.  DESCRIPTION:  The patient was taken to  the operating room and placed supine on the operating table.  General endotracheal tube anesthesia was administered by the anesthesiologist.  The patient was positioned and prepped and draped in the standard fashion for left hemithyroidectomy. A 1% lidocaine with 1:100,000 epinephrine was injected at the planned site of incision.  A transverse low neck incision was made over the thyroid gland.  The incision was carried down to the level of the platysma muscles.  Subplatysmal flaps were elevated superiorly and inferiorly.  At this time, the patient was noted to have severe displacement of trachea to the right side.  The left strap muscles were divided transversely, exposing the large left thyroid goiter.  Careful dissection was then performed to separate the large left thyroid lobe from the surrounding soft tissue.  Both the superior and inferior parathyroid glands were identified and preserved.  The entire left thyroid lobe with a large nodule were then removed.  The surgical site was copiously irrigated.  The entire left thyroid lobe was sent to the Pathology Department for frozen section analysis.  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  The definitive diagnosis was deferred.  A #10 JP drain was then placed.  The strap muscles were reapproximated with 4-0 Vicryl sutures.  The incision was closed  in layers with 4-0 Vicryl and Dermabond.  The care of the patient was turned over to the anesthesiologist.  The patient was awakened from anesthesia without difficulty.  She was extubated and transferred to the recovery room in good condition.  FINDINGS:  A large 9-cm left thyroid goiter was noted, the frozen section finding was consistent with a follicular lesion.  SPECIMEN:  Left thyroid lobe.  FOLLOWUP CARE:  The patient will be observed overnight in the hospital. She will most likely be discharged home on postop day #1.     Tina Pies, MD     ST/MEDQ  D:  01/16/2012   T:  01/17/2012  Job:  [id number]  cc:   Veverly Fells. Altheimer, M.D.

## 2012-01-22 ENCOUNTER — Encounter (HOSPITAL_COMMUNITY): Payer: Self-pay | Admitting: Otolaryngology

## 2012-02-06 ENCOUNTER — Ambulatory Visit: Admit: 2012-02-06 | Payer: Self-pay | Admitting: General Surgery

## 2012-02-06 SURGERY — LOBECTOMY, THYROID
Anesthesia: General | Laterality: Left

## 2013-01-04 IMAGING — US US SOFT TISSUE HEAD/NECK
1 series · 14 of 25 positions shown · non-contrast
Comparison: 04/16/2009 and earlier.

CLINICAL DATA: 51-year-old female with history of goiter and 2555
biopsy.

THYROID ULTRASOUND
TECHNIQUE: Ultrasound examination of the thyroid gland and adjacent
soft tissues was performed.

[Series 1: us soft tissue head/neck · 0.11mm/px · 14 of 56 slices shown]
[im 1/56]
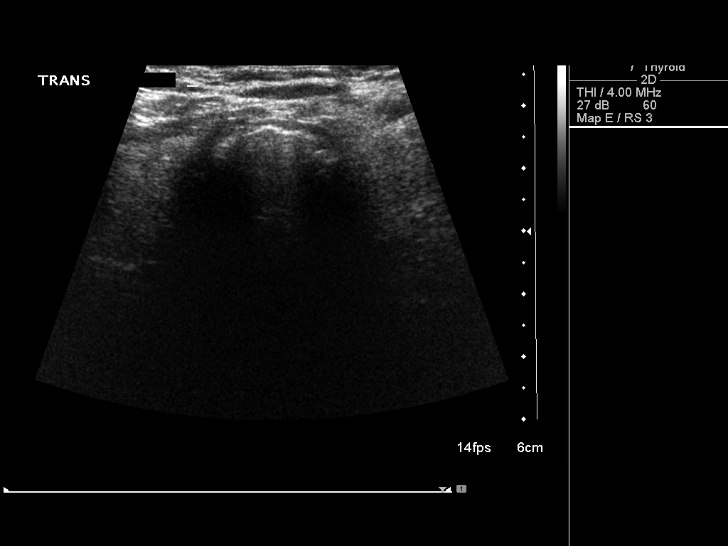
[im 5/56]
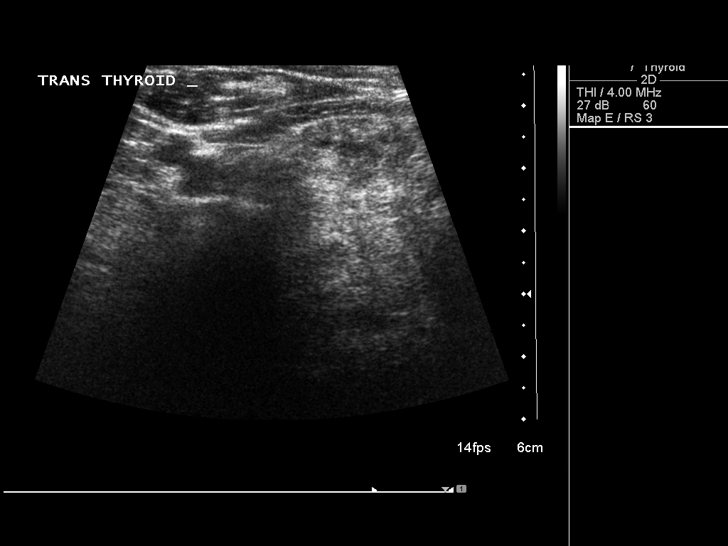
[im 10/56]
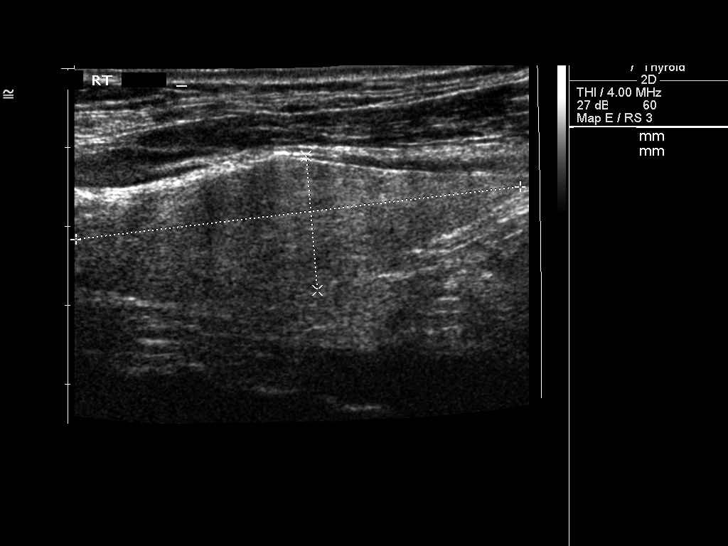
[im 14/56]
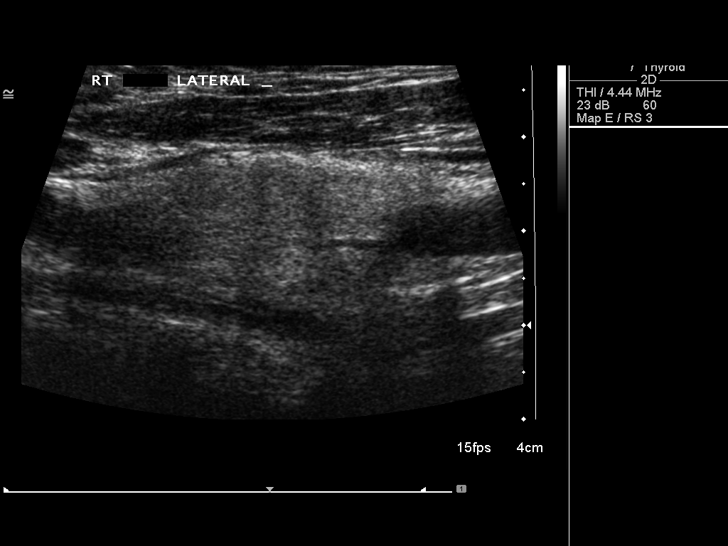
[im 19/56]
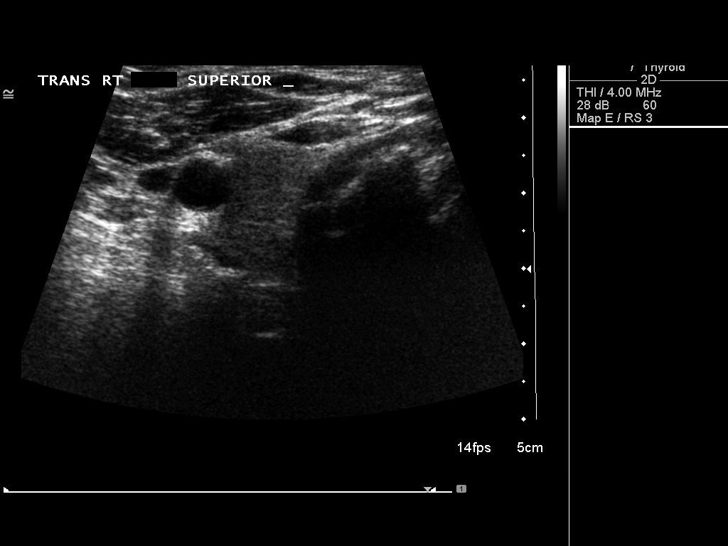
[im 21/56]
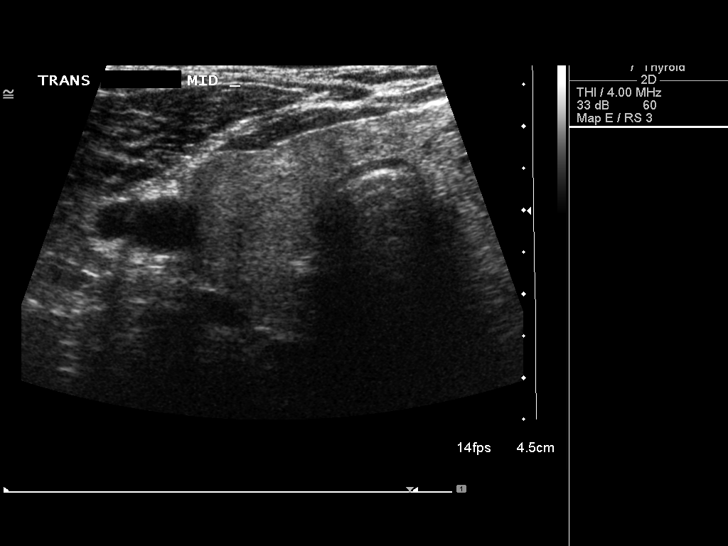
[im 26/56]
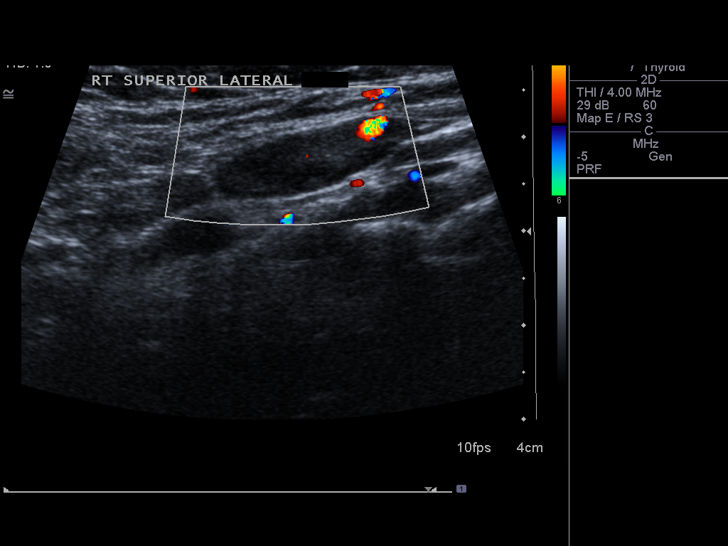
[im 30/56]
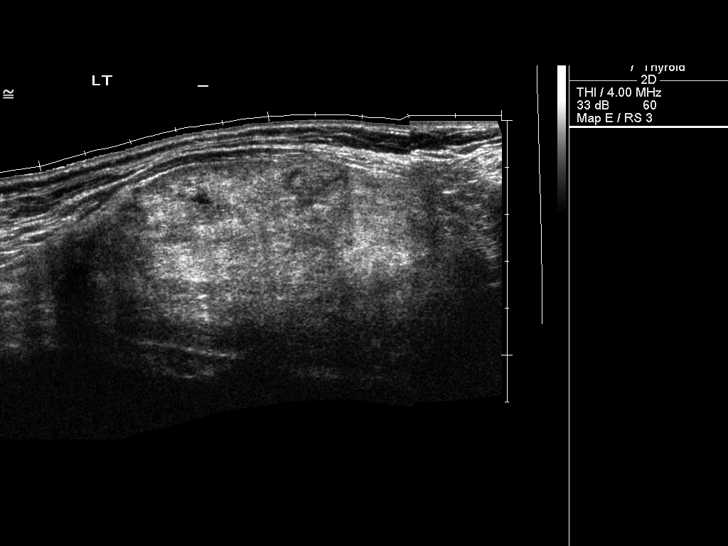
[im 35/56]
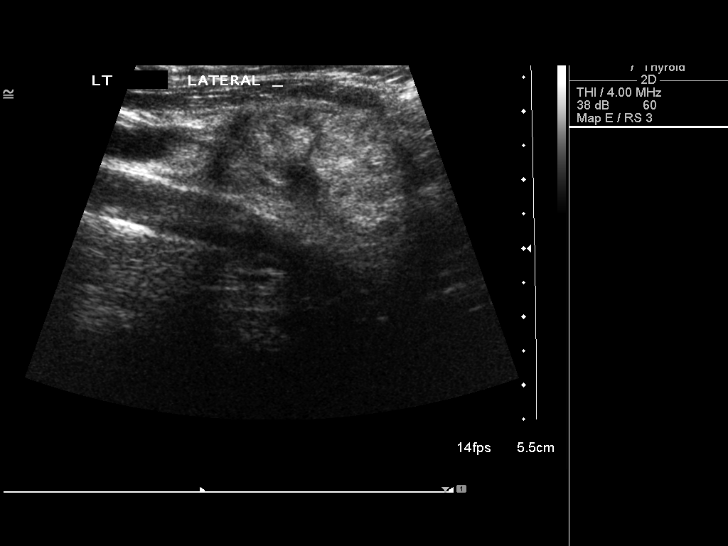
[im 37/56]
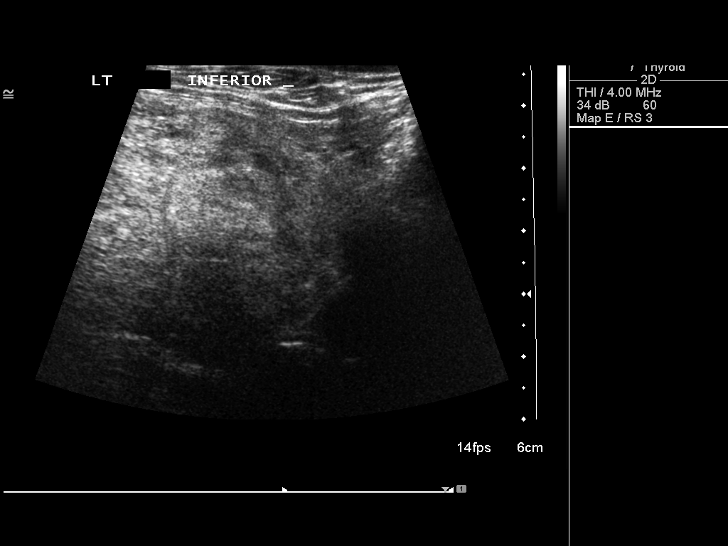
[im 42/56]
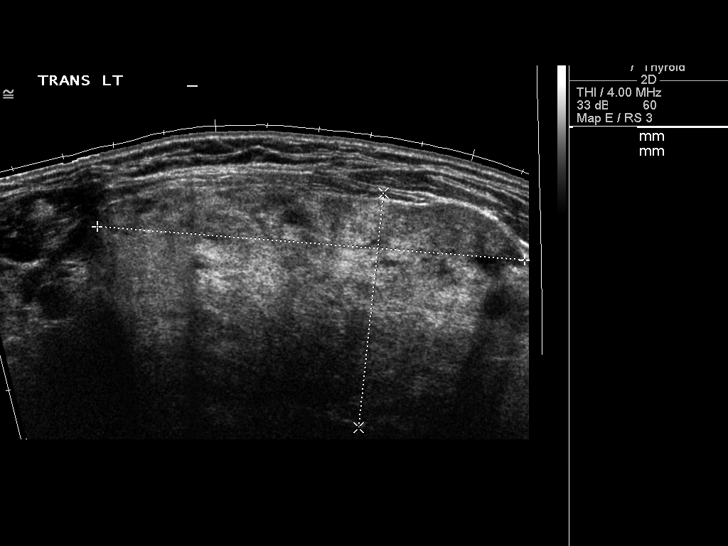
[im 46/56]
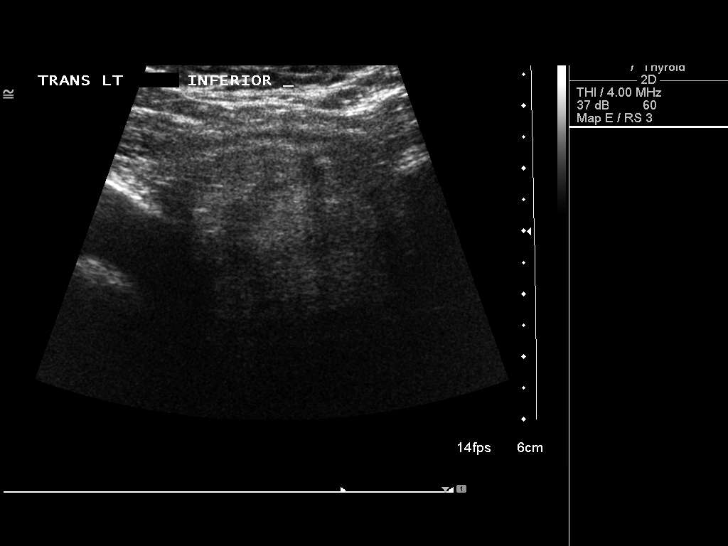
[im 51/56]
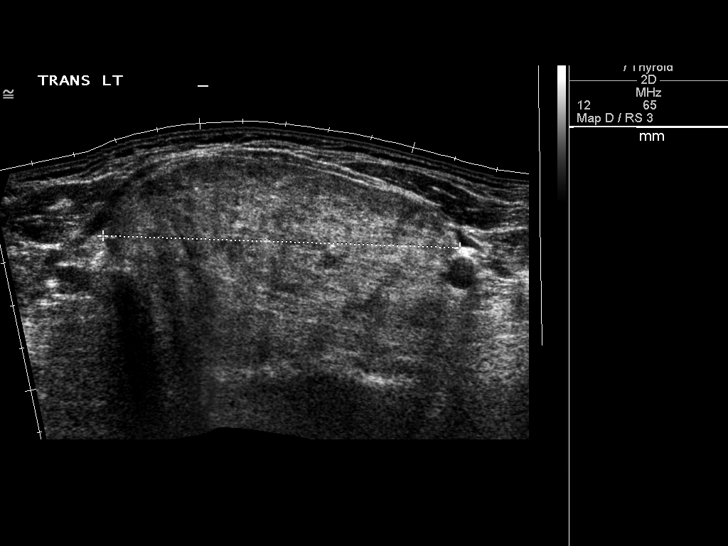
[im 56/56]
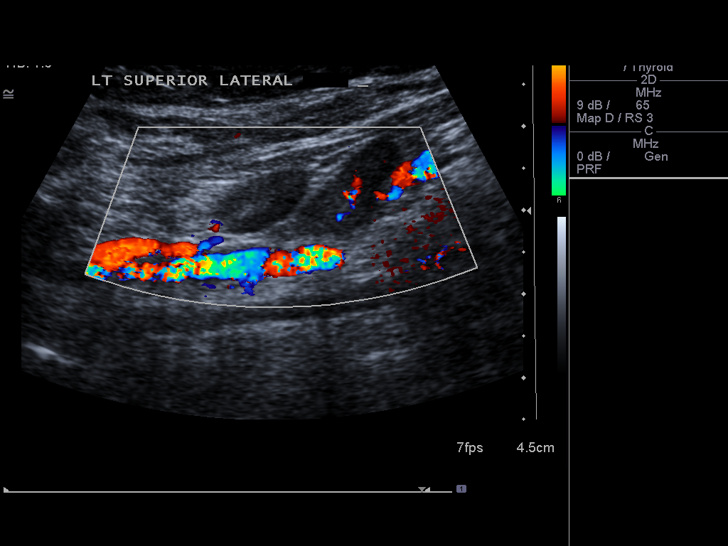

[14 of 25 positions shown; findings below may reference images not displayed]

FINDINGS: Right thyroid lobe:  5.7 x 1.7 x 1.7 cm (unchanged).
Left thyroid lobe:  10.7 x 4.8 x 8.3 cm (previously 10.0 x 4.8 x
7.6 cm).
Isthmus:  9 mm (previously 12 mm).

Focal nodules:
Right:  Tiny hypoechoic isoechoic nodule measuring 5 mm.
Isthmus:  None:
Left:  Large complex solid nodule/mass measuring 9.5 x 5.1 x
cm (previously 9.1 x 4.6 x 8.3 cm).

Lymphadenopathy:  Bilateral level IV lymph nodes measure up to 8 mm
in short axis, at the upper limits of normal.  No convincing
hypervascularity of these nodes on Doppler.
IMPRESSION: 1.  Large left thyroid goiter/mass has mildly slightly in size
since [DATE]. Remaining thyroid parenchyma remains within normal limits.
3.  Bilateral 8 mm lymph nodes are within normal limits, attention
directed on follow-up.

## 2013-12-26 ENCOUNTER — Other Ambulatory Visit: Payer: Self-pay | Admitting: Family Medicine

## 2013-12-26 ENCOUNTER — Other Ambulatory Visit (HOSPITAL_COMMUNITY)
Admission: RE | Admit: 2013-12-26 | Discharge: 2013-12-26 | Disposition: A | Discharge status: Home / Self Care | Payer: 59 | Source: Ambulatory Visit | Attending: Family Medicine | Admitting: Family Medicine

## 2013-12-26 DIAGNOSIS — Z124 Encounter for screening for malignant neoplasm of cervix: Secondary | ICD-10-CM | POA: Diagnosis not present

## 2013-12-29 LAB — CYTOLOGY - PAP

## 2014-01-26 ENCOUNTER — Encounter (HOSPITAL_COMMUNITY): Payer: Self-pay | Admitting: *Deleted

## 2014-01-26 NOTE — Progress Notes (Signed)
LVM with Abigail Butts, surgical scheduler, regarding no patient orders in Epic.

## 2014-01-26 NOTE — Progress Notes (Addendum)
Pt denies SOB, chest pain, and being under the care of a cardiologist. Pt denies having an EKG and chest x ray within the last year. Pt denies having a stress test, echo, and cardiac cath. Pt stated that she had difficulty being intubated with her hernia surgery in 2013, " my air pipe was like an H until I  had part of my thyroid removed, now they say that it is like a straw, there should be a note in my records about that." Spoke with [place], [place] ( anesthesia) regarding anesthesia complication; no new orders given. Pt made aware to stop taking Aspirin, vitamins and herbal medications. Do not take any NSAIDs ie: Ibuprofen, Advil, Naproxen or any medication containing Aspirin. Pt made aware not to take any Diabetic medications the morning of procedure.

## 2014-01-27 ENCOUNTER — Encounter (HOSPITAL_COMMUNITY): Payer: Self-pay | Admitting: Anesthesiology

## 2014-01-27 ENCOUNTER — Ambulatory Visit (HOSPITAL_COMMUNITY): Payer: Worker's Compensation | Admitting: Vascular Surgery

## 2014-01-27 ENCOUNTER — Encounter (HOSPITAL_COMMUNITY)
Admission: RE | Disposition: A | Discharge status: Home / Self Care | Payer: Self-pay | Source: Ambulatory Visit | Attending: Orthopedic Surgery

## 2014-01-27 ENCOUNTER — Ambulatory Visit (HOSPITAL_COMMUNITY)
Admission: RE | Admit: 2014-01-27 | Discharge: 2014-01-27 | Disposition: A | Discharge status: Home / Self Care | Payer: Worker's Compensation | Source: Ambulatory Visit | Attending: Orthopedic Surgery | Admitting: Orthopedic Surgery

## 2014-01-27 DIAGNOSIS — K219 Gastro-esophageal reflux disease without esophagitis: Secondary | ICD-10-CM | POA: Insufficient documentation

## 2014-01-27 DIAGNOSIS — M7662 Achilles tendinitis, left leg: Secondary | ICD-10-CM | POA: Diagnosis present

## 2014-01-27 DIAGNOSIS — M21542 Acquired clubfoot, left foot: Secondary | ICD-10-CM | POA: Diagnosis not present

## 2014-01-27 DIAGNOSIS — E119 Type 2 diabetes mellitus without complications: Secondary | ICD-10-CM | POA: Diagnosis not present

## 2014-01-27 DIAGNOSIS — E559 Vitamin D deficiency, unspecified: Secondary | ICD-10-CM | POA: Insufficient documentation

## 2014-01-27 HISTORY — DX: Personal history of other diseases of the digestive system: Z87.19

## 2014-01-27 HISTORY — PX: ACHILLES TENDON SURGERY: SHX542

## 2014-01-27 HISTORY — PX: GASTROCNEMIUS RECESSION: SHX863

## 2014-01-27 HISTORY — DX: Failed or difficult intubation, initial encounter: T88.4XXA

## 2014-01-27 LAB — CBC
HEMATOCRIT: 39 % (ref 36.0–46.0)
HEMOGLOBIN: 12.9 g/dL (ref 12.0–15.0)
MCH: 28 pg (ref 26.0–34.0)
MCHC: 33.1 g/dL (ref 30.0–36.0)
MCV: 84.8 fL (ref 78.0–100.0)
Platelets: 330 10*3/uL (ref 150–400)
RBC: 4.6 MIL/uL (ref 3.87–5.11)
RDW: 14 % (ref 11.5–15.5)
WBC: 7.3 10*3/uL (ref 4.0–10.5)

## 2014-01-27 LAB — BASIC METABOLIC PANEL
Anion gap: 8 (ref 5–15)
BUN: 15 mg/dL (ref 6–23)
CO2: 25 mmol/L (ref 19–32)
CREATININE: 0.66 mg/dL (ref 0.50–1.10)
Calcium: 9.3 mg/dL (ref 8.4–10.5)
Chloride: 106 mEq/L (ref 96–112)
GFR calc Af Amer: >90 mL/min (ref >90)
GFR calc non Af Amer: >90 mL/min (ref >90)
GLUCOSE: 110 mg/dL — AB (ref 70–99)
Potassium: 4.1 mmol/L (ref 3.5–5.1)
Sodium: 139 mmol/L (ref 135–145)

## 2014-01-27 LAB — GLUCOSE, CAPILLARY
GLUCOSE-CAPILLARY: 96 mg/dL (ref 70–99)
Glucose-Capillary: 104 mg/dL — ABNORMAL HIGH (ref 70–99)

## 2014-01-27 SURGERY — REPAIR, TENDON, ACHILLES
Anesthesia: General | Site: Leg Lower | Laterality: Left

## 2014-01-27 MED ORDER — 0.9 % SODIUM CHLORIDE (POUR BTL) OPTIME
TOPICAL | Status: DC | PRN
  Administered 2014-01-27: 1000 mL

## 2014-01-27 MED ORDER — ROCURONIUM BROMIDE 50 MG/5ML IV SOLN
INTRAVENOUS | Status: AC
  Filled 2014-01-27: qty 1

## 2014-01-27 MED ORDER — BACITRACIN ZINC 500 UNIT/GM EX OINT
TOPICAL_OINTMENT | CUTANEOUS | Status: AC
  Filled 2014-01-27: qty 28.35

## 2014-01-27 MED ORDER — OXYCODONE HCL 5 MG PO TABS: 5.0000 mg | ORAL_TABLET | ORAL | Status: AC | PRN

## 2014-01-27 MED ORDER — CEFAZOLIN SODIUM-DEXTROSE 2-3 GM-% IV SOLR
2.0000 g | INTRAVENOUS | Status: AC
  Administered 2014-01-27: 2 g via INTRAVENOUS

## 2014-01-27 MED ORDER — ONDANSETRON HCL 4 MG/2ML IJ SOLN: 4.0000 mg | Freq: Once | INTRAMUSCULAR | Status: DC | PRN

## 2014-01-27 MED ORDER — PHENYLEPHRINE HCL 10 MG/ML IJ SOLN
INTRAMUSCULAR | Status: DC | PRN
  Administered 2014-01-27 (×2): 80 ug via INTRAVENOUS
  Administered 2014-01-27: 120 ug via INTRAVENOUS

## 2014-01-27 MED ORDER — FENTANYL CITRATE 0.05 MG/ML IJ SOLN
INTRAMUSCULAR | Status: DC | PRN
  Administered 2014-01-27: 100 ug via INTRAVENOUS
  Administered 2014-01-27 (×2): 50 ug via INTRAVENOUS

## 2014-01-27 MED ORDER — GLYCOPYRROLATE 0.2 MG/ML IJ SOLN
INTRAMUSCULAR | Status: AC
  Filled 2014-01-27: qty 2

## 2014-01-27 MED ORDER — LIDOCAINE HCL (CARDIAC) 20 MG/ML IV SOLN
INTRAVENOUS | Status: DC | PRN
  Administered 2014-01-27: 100 mg via INTRAVENOUS

## 2014-01-27 MED ORDER — ONDANSETRON HCL 4 MG/2ML IJ SOLN
INTRAMUSCULAR | Status: AC
  Filled 2014-01-27: qty 2

## 2014-01-27 MED ORDER — HYDROMORPHONE HCL 1 MG/ML IJ SOLN
0.2500 mg | INTRAMUSCULAR | Status: DC | PRN
  Administered 2014-01-27 (×4): 0.5 mg via INTRAVENOUS

## 2014-01-27 MED ORDER — OXYCODONE HCL 5 MG PO TABS
10.0000 mg | ORAL_TABLET | Freq: Once | ORAL | Status: AC | PRN
  Administered 2014-01-27: 10 mg via ORAL

## 2014-01-27 MED ORDER — NEOSTIGMINE METHYLSULFATE 10 MG/10ML IV SOLN
INTRAVENOUS | Status: DC | PRN
Start: 2014-01-27 — End: 2014-01-27
  Administered 2014-01-27: 4 mg via INTRAVENOUS

## 2014-01-27 MED ORDER — CHLORHEXIDINE GLUCONATE 4 % EX LIQD
60.0000 mL | Freq: Once | CUTANEOUS | Status: DC
  Filled 2014-01-27: qty 60

## 2014-01-27 MED ORDER — DEXAMETHASONE SODIUM PHOSPHATE 4 MG/ML IJ SOLN
INTRAMUSCULAR | Status: AC
  Filled 2014-01-27: qty 2

## 2014-01-27 MED ORDER — SUCCINYLCHOLINE CHLORIDE 20 MG/ML IJ SOLN
INTRAMUSCULAR | Status: AC
  Filled 2014-01-27: qty 1

## 2014-01-27 MED ORDER — BACITRACIN ZINC 500 UNIT/GM EX OINT
TOPICAL_OINTMENT | CUTANEOUS | Status: DC | PRN
  Administered 2014-01-27: 1 via TOPICAL

## 2014-01-27 MED ORDER — HYDROMORPHONE HCL 1 MG/ML IJ SOLN
INTRAMUSCULAR | Status: AC
  Administered 2014-01-27: 0.5 mg via INTRAVENOUS
  Filled 2014-01-27: qty 1

## 2014-01-27 MED ORDER — SUCCINYLCHOLINE CHLORIDE 20 MG/ML IJ SOLN
INTRAMUSCULAR | Status: DC | PRN
  Administered 2014-01-27: 100 mg via INTRAVENOUS

## 2014-01-27 MED ORDER — GLYCOPYRROLATE 0.2 MG/ML IJ SOLN
INTRAMUSCULAR | Status: DC | PRN
  Administered 2014-01-27: 0.6 mg via INTRAVENOUS

## 2014-01-27 MED ORDER — DEXAMETHASONE SODIUM PHOSPHATE 4 MG/ML IJ SOLN
INTRAMUSCULAR | Status: DC | PRN
  Administered 2014-01-27: 8 mg via INTRAVENOUS

## 2014-01-27 MED ORDER — NEOSTIGMINE METHYLSULFATE 10 MG/10ML IV SOLN
INTRAVENOUS | Status: AC
  Filled 2014-01-27: qty 1

## 2014-01-27 MED ORDER — ONDANSETRON HCL 4 MG/2ML IJ SOLN
INTRAMUSCULAR | Status: DC | PRN
  Administered 2014-01-27: 4 mg via INTRAVENOUS

## 2014-01-27 MED ORDER — KETOROLAC TROMETHAMINE 30 MG/ML IJ SOLN
INTRAMUSCULAR | Status: AC
  Administered 2014-01-27: 30 mg via INTRAVENOUS
  Filled 2014-01-27: qty 1

## 2014-01-27 MED ORDER — LACTATED RINGERS IV SOLN
INTRAVENOUS | Status: DC | PRN
  Administered 2014-01-27: 14:00:00 via INTRAVENOUS

## 2014-01-27 MED ORDER — MIDAZOLAM HCL 2 MG/2ML IJ SOLN
INTRAMUSCULAR | Status: AC
  Filled 2014-01-27: qty 2

## 2014-01-27 MED ORDER — LIDOCAINE HCL (CARDIAC) 20 MG/ML IV SOLN
INTRAVENOUS | Status: AC
  Filled 2014-01-27: qty 5

## 2014-01-27 MED ORDER — FENTANYL CITRATE 0.05 MG/ML IJ SOLN
INTRAMUSCULAR | Status: AC
  Filled 2014-01-27: qty 5

## 2014-01-27 MED ORDER — ROCURONIUM BROMIDE 100 MG/10ML IV SOLN
INTRAVENOUS | Status: DC | PRN
  Administered 2014-01-27: 30 mg via INTRAVENOUS

## 2014-01-27 MED ORDER — PHENYLEPHRINE 40 MCG/ML (10ML) SYRINGE FOR IV PUSH (FOR BLOOD PRESSURE SUPPORT)
PREFILLED_SYRINGE | INTRAVENOUS | Status: AC
  Filled 2014-01-27: qty 20

## 2014-01-27 MED ORDER — MIDAZOLAM HCL 5 MG/5ML IJ SOLN
INTRAMUSCULAR | Status: DC | PRN
  Administered 2014-01-27: 2 mg via INTRAVENOUS

## 2014-01-27 MED ORDER — PROPOFOL 10 MG/ML IV BOLUS
INTRAVENOUS | Status: DC | PRN
  Administered 2014-01-27: 200 mg via INTRAVENOUS

## 2014-01-27 MED ORDER — OXYCODONE HCL 5 MG PO TABS
ORAL_TABLET | ORAL | Status: AC
  Administered 2014-01-27: 10 mg via ORAL
  Filled 2014-01-27: qty 2

## 2014-01-27 MED ORDER — PROPOFOL 10 MG/ML IV BOLUS
INTRAVENOUS | Status: AC
  Filled 2014-01-27: qty 20

## 2014-01-27 MED ORDER — KETOROLAC TROMETHAMINE 30 MG/ML IJ SOLN
30.0000 mg | Freq: Once | INTRAMUSCULAR | Status: AC
  Administered 2014-01-27: 30 mg via INTRAVENOUS

## 2014-01-27 MED ORDER — LACTATED RINGERS IV SOLN
INTRAVENOUS | Status: DC
  Administered 2014-01-27: 14:00:00 via INTRAVENOUS

## 2014-01-27 SURGICAL SUPPLY — 54 items
BANDAGE ELASTIC 4 VELCRO ST LF (GAUZE/BANDAGES/DRESSINGS) ×2 IMPLANT
BANDAGE ESMARK 6X9 LF (GAUZE/BANDAGES/DRESSINGS) ×1 IMPLANT
BLADE LONG MED 31MMX9MM (MISCELLANEOUS) ×1
BLADE LONG MED 31X9 (MISCELLANEOUS) ×1 IMPLANT
BLADE SURG 10 STRL SS (BLADE) ×3 IMPLANT
BNDG CMPR 9X6 STRL LF SNTH (GAUZE/BANDAGES/DRESSINGS) ×1
BNDG COHESIVE 4X5 TAN STRL (GAUZE/BANDAGES/DRESSINGS) ×1 IMPLANT
BNDG COHESIVE 6X5 TAN STRL LF (GAUZE/BANDAGES/DRESSINGS) ×1 IMPLANT
BNDG ESMARK 6X9 LF (GAUZE/BANDAGES/DRESSINGS) ×3
CANISTER SUCT 3000ML (MISCELLANEOUS) ×3 IMPLANT
CHLORAPREP W/TINT 26ML (MISCELLANEOUS) ×3 IMPLANT
COVER SURGICAL LIGHT HANDLE (MISCELLANEOUS) ×3 IMPLANT
CUFF TOURNIQUET SINGLE 24IN (TOURNIQUET CUFF) ×2 IMPLANT
CUFF TOURNIQUET SINGLE 34IN LL (TOURNIQUET CUFF) ×3 IMPLANT
CUFF TOURNIQUET SINGLE 44IN (TOURNIQUET CUFF) IMPLANT
DRAPE U-SHAPE 47X51 STRL (DRAPES) ×3 IMPLANT
DRSG ADAPTIC 3X8 NADH LF (GAUZE/BANDAGES/DRESSINGS) ×2 IMPLANT
DRSG PAD ABDOMINAL 8X10 ST (GAUZE/BANDAGES/DRESSINGS) ×2 IMPLANT
ELECT REM PT RETURN 9FT ADLT (ELECTROSURGICAL) ×3
ELECTRODE REM PT RTRN 9FT ADLT (ELECTROSURGICAL) ×1 IMPLANT
GAUZE SPONGE 4X4 12PLY STRL (GAUZE/BANDAGES/DRESSINGS) IMPLANT
GLOVE BIO SURGEON STRL SZ7 (GLOVE) ×6 IMPLANT
GLOVE BIO SURGEON STRL SZ8 (GLOVE) ×3 IMPLANT
GLOVE BIOGEL PI IND STRL 7.5 (GLOVE) ×1 IMPLANT
GLOVE BIOGEL PI IND STRL 8 (GLOVE) ×1 IMPLANT
GLOVE BIOGEL PI INDICATOR 7.5 (GLOVE) ×2
GLOVE BIOGEL PI INDICATOR 8 (GLOVE) ×2
GOWN STRL REUS W/ TWL LRG LVL3 (GOWN DISPOSABLE) ×2 IMPLANT
GOWN STRL REUS W/ TWL XL LVL3 (GOWN DISPOSABLE) ×1 IMPLANT
GOWN STRL REUS W/TWL LRG LVL3 (GOWN DISPOSABLE) ×6
GOWN STRL REUS W/TWL XL LVL3 (GOWN DISPOSABLE) ×3
KIT BASIN OR (CUSTOM PROCEDURE TRAY) ×3 IMPLANT
KIT ROOM TURNOVER OR (KITS) ×3 IMPLANT
NEEDLE 22X1 1/2 (OR ONLY) (NEEDLE) IMPLANT
NS IRRIG 1000ML POUR BTL (IV SOLUTION) ×3 IMPLANT
PACK ORTHO EXTREMITY (CUSTOM PROCEDURE TRAY) ×3 IMPLANT
PAD ARMBOARD 7.5X6 YLW CONV (MISCELLANEOUS) ×6 IMPLANT
PAD CAST 4YDX4 CTTN HI CHSV (CAST SUPPLIES) ×1 IMPLANT
PADDING CAST COTTON 4X4 STRL (CAST SUPPLIES) ×3
SOAP 2 % CHG 4 OZ (WOUND CARE) ×3 IMPLANT
SPONGE GAUZE 4X4 12PLY STER LF (GAUZE/BANDAGES/DRESSINGS) ×2 IMPLANT
SPONGE LAP 18X18 X RAY DECT (DISPOSABLE) ×3 IMPLANT
SUCTION FRAZIER TIP 10 FR DISP (SUCTIONS) ×3 IMPLANT
SUT ETHILON 3 0 PS 1 (SUTURE) ×4 IMPLANT
SUT MNCRL AB 3-0 PS2 18 (SUTURE) ×3 IMPLANT
SUT PROLENE 3 0 PS 2 (SUTURE) IMPLANT
SUT VIC AB 3-0 PS2 18 (SUTURE) ×3
SUT VIC AB 3-0 PS2 18XBRD (SUTURE) ×1 IMPLANT
SYR CONTROL 10ML LL (SYRINGE) IMPLANT
TOWEL OR 17X24 6PK STRL BLUE (TOWEL DISPOSABLE) ×3 IMPLANT
TOWEL OR 17X26 10 PK STRL BLUE (TOWEL DISPOSABLE) ×3 IMPLANT
TUBE CONNECTING 12'X1/4 (SUCTIONS) ×1
TUBE CONNECTING 12X1/4 (SUCTIONS) ×2 IMPLANT
WATER STERILE IRR 1000ML POUR (IV SOLUTION) ×1 IMPLANT

## 2014-01-27 NOTE — Progress Notes (Signed)
Orthopedic Tech Progress Note Patient Details:  Tina [name] Sep 04, 1960 [id number]  Ortho Devices Type of Ortho Device: Crutches Ortho Device/Splint Interventions: Ordered, Adjustment   Braulio Bosch 01/27/2014, 6:23 PM

## 2014-01-27 NOTE — Anesthesia Preprocedure Evaluation (Addendum)
Anesthesia Evaluation  Patient identified by MRN, date of birth, ID band Patient awake    Reviewed: Allergy & Precautions, NPO status , Patient's Chart, lab work & pertinent test results, reviewed documented beta blocker date and time   History of Anesthesia Complications (+) DIFFICULT AIRWAYHistory of anesthetic complications: Intubation not sucessful for hernia surgery LMA 4 supreme used, later that year fiber optic awake done for thyroid surgery and 6.5 tube passed, easy mask ventilation on both occasions.    Airway Mallampati: III  TM Distance: <3 FB Neck ROM: Full  Mouth opening: Limited Mouth Opening  Dental  (+) Dental Advisory Given, Caps, Teeth Intact   Pulmonary neg pulmonary ROS,          Cardiovascular negative cardio ROS      Neuro/Psych negative neurological ROS  negative psych ROS   GI/Hepatic Neg liver ROS, GERD-  Medicated and Controlled,  Endo/Other  diabetes, Type 2, Oral Hypoglycemic Agents  Renal/GU negative Renal ROS     Musculoskeletal   Abdominal   Peds  Hematology negative hematology ROS (+)   Anesthesia Other Findings   Reproductive/Obstetrics                       Anesthesia Physical Anesthesia Plan  ASA: II  Anesthesia Plan: General and Spinal   Post-op Pain Management:    Induction: Intravenous  Airway Management Planned: LMA and Oral ETT  Additional Equipment:   Intra-op Plan:   Post-operative Plan:   Informed Consent: I have reviewed the patients History and Physical, chart, labs and discussed the procedure including the risks, benefits and alternatives for the proposed anesthesia with the patient or authorized representative who has indicated his/her understanding and acceptance.     Plan Discussed with: CRNA, Anesthesiologist and Surgeon  Anesthesia Plan Comments: (Will offer spinal, if does not want would consider GA with LMA and popliteal block  for post pain management, Intubation only if prone and then would use 6.5 tube and have glide and fiberoptic available)       Anesthesia Quick Evaluation

## 2014-01-27 NOTE — Brief Op Note (Signed)
01/27/2014  4:32 PM  PATIENT:  Tina [name]  54 y.o. female  PRE-OPERATIVE DIAGNOSIS: 1.  Left achilles tendonitis      2.  Left tight heelcord  POST-OPERATIVE DIAGNOSIS:  same  Procedure(s): 1.  LEFT ACHILLES DEBRIDEMENT/REPAIR 2.  LEFT GASTROC RECESSION through a separate incision  SURGEON:  Wylene Simmer, MD  ASSISTANT: n/a  ANESTHESIA:   General, regional  EBL:  minimal   TOURNIQUET:   Total Tourniquet Time Documented: Thigh (Left) - 41 minutes Total: Thigh (Left) - 41 minutes   COMPLICATIONS:  None apparent  DISPOSITION:  Extubated, awake and stable to recovery.  DICTATION ID:  [id number]

## 2014-01-27 NOTE — Discharge Instructions (Signed)
Tina Simmer, MD [place]  Please read the following information regarding your care after surgery.  Medications  You only need a prescription for the narcotic pain medicine (ex. oxycodone, Percocet, Norco).  All of the other medicines listed below are available over the counter. X acetominophen (Tylenol) 650 mg every 4-6 hours as you need for minor pain X oxycodone as prescribed for moderate to severe pain   Narcotic pain medicine (ex. oxycodone, Percocet, Vicodin) will cause constipation.  To prevent this problem, take the following medicines while you are taking any pain medicine. X docusate sodium (Colace) 100 mg twice a day X senna (Senokot) 2 tablets twice a day  Weight Bearing X Bear weight when you are able on your operated leg or foot in the CAM walker boot.  Cast / Splint / Dressing X Keep your dressing clean and dry.  Dont put anything (coat hanger, pencil, etc) down inside of it.  If it gets damp, use a hair dryer on the cool setting to dry it.  If it gets soaked, call the office to schedule an appointment for a dressing change.  After your dressing, cast or splint is removed; you may shower, but do not soak or scrub the wound.  Allow the water to run over it, and then gently pat it dry.  Swelling It is normal for you to have swelling where you had surgery.  To reduce swelling and pain, keep your toes above your nose for at least 3 days after surgery.  It may be necessary to keep your foot or leg elevated for several weeks.  If it hurts, it should be elevated.  Follow Up Call my office at [phone] when you are discharged from the hospital or surgery center to schedule an appointment to be seen two weeks after surgery.  Call my office at [phone] if you develop a fever >101.5 F, nausea, vomiting, bleeding from the surgical site or severe pain.    General Anesthesia, Adult, Care After  Refer to this sheet in the next few weeks. These instructions  provide you with information on caring for yourself after your procedure. Your health care provider may also give you more specific instructions. Your treatment has been planned according to current medical practices, but problems sometimes occur. Call your health care provider if you have any problems or questions after your procedure.  WHAT TO EXPECT AFTER THE PROCEDURE  After the procedure, it is typical to experience:  Sleepiness.  Nausea and vomiting. HOME CARE INSTRUCTIONS  For the first 24 hours after general anesthesia:  Have a responsible person with you.  Do not drive a car. If you are alone, do not take public transportation.  Do not drink alcohol.  Do not take medicine that has not been prescribed by your health care provider.  Do not sign important papers or make important decisions.  You may resume a normal diet and activities as directed by your health care provider.  Change bandages (dressings) as directed.  If you have questions or problems that seem related to general anesthesia, call the hospital and ask for the anesthetist or anesthesiologist on call. SEEK MEDICAL CARE IF:  You have nausea and vomiting that continue the day after anesthesia.  You develop a rash. SEEK IMMEDIATE MEDICAL CARE IF:  You have difficulty breathing.  You have chest pain.  You have any allergic problems. Document Released: 04/17/2000 Document Revised: 09/11/2012 Document Reviewed: 07/25/2012  [facility] Patient Information 2014 [place], [place].

## 2014-01-27 NOTE — Anesthesia Postprocedure Evaluation (Signed)
Anesthesia Post Note  Patient: Tina [name]  Procedure(s) Performed: Procedure(s) (LRB): LEFT ACHILLES DEBRIDEMENT/REPAIR (Left) LEFT GASTROC RECESSION (Left)  Anesthesia type: general  Patient location: PACU  Post pain: Pain level controlled  Post assessment: Patient's Cardiovascular Status Stable  Last Vitals:  Filed Vitals:   01/27/14 1800  BP: 94/61  Pulse: 70  Temp:   Resp: 21    Post vital signs: Reviewed and stable  Level of consciousness: sedated  Complications: No apparent anesthesia complications
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
## 2014-01-27 NOTE — Transfer of Care (Signed)
Immediate Anesthesia Transfer of Care Note  Patient: Tina [name]  Procedure(s) Performed: Procedure(s): LEFT ACHILLES DEBRIDEMENT/REPAIR (Left) LEFT GASTROC RECESSION (Left)  Patient Location: PACU  Anesthesia Type:General  Level of Consciousness: awake, alert , oriented and patient cooperative  Airway & Oxygen Therapy: Patient Spontanous Breathing and Patient connected to nasal cannula oxygen  Post-op Assessment: Report given to PACU RN and Post -op Vital signs reviewed and stable  Post vital signs: Reviewed and stable  Complications: No apparent anesthesia complications

## 2014-01-28 NOTE — Op Note (Signed)
Tina [name], Tina [name] NO.:  [id number]  MEDICAL RECORD NO.:  [id number]  LOCATION:  [facility]                         FACILITY:  [facility]  PHYSICIAN:  Wylene Simmer, MD        DATE OF BIRTH:  06/25/1960  DATE OF PROCEDURE:  01/27/2014 DATE OF DISCHARGE:  01/27/2014                              OPERATIVE REPORT   PREOPERATIVE DIAGNOSES: 1. Left Achilles tendonitis. 2. Left tight heel cord.  POSTOPERATIVE DIAGNOSES: 1. Left Achilles tendonitis. 2. Left tight heel cord.  PROCEDURE: 1. Left Achilles tendon debridement and repair. 2. Left gastrocnemius recession through a separate incision.  SURGEON:  Wylene Simmer, MD  ANESTHESIA:  General, regional.  ESTIMATED BLOOD LOSS:  Minimal.  TOURNIQUET TIME:  41 minutes at 250 mmHg.  COMPLICATIONS:  None apparent.  DISPOSITION:  Extubated, awake, and stable to recovery.  INDICATIONS FOR PROCEDURE:  The patient is a 54 year old woman with an injury to the left Achilles at work.  She has developed a painful nodule at the Achilles as well as a tight heel cord.  She has failed nonoperative treatment to date and presents today for surgical treatment.  She understands the risks and benefits, the alternative treatment options, and elects surgical treatment.  She specifically understands risks of bleeding, infection, nerve damage, blood clots, need for additional surgery, continued pain, recurrence of the condition, amputation, and death.  PROCEDURE IN DETAIL:  After preoperative consent was obtained and the correct operative site was identified, the patient was brought to the operating room supine on a stretcher.  General anesthesia was induced. Preoperative antibiotics were administered.  Surgical time-out was taken.  The left lower extremity was exsanguinated and a thigh tourniquet was inflated to 250 mmHg.  The patient was then turned into the prone position on the operating table with all bony prominences padded well.   Left lower extremity was then prepped and draped in standard sterile fashion.  A longitudinal incision was made at the gastrocnemius tendon.  Sharp dissection was carried down through the skin and subcutaneous tissue.  Care was taken to protect the sural nerve and lesser saphenous vein.  The gastrocnemius tendon was identified.  It was transected from medial to lateral in its entirety.  The ankle would dorsiflex approximately 15 degrees with knee extended.  Wound was irrigated and closed with Monocryl and nylon.  Attention was then turned to the nodular thickening in the Achilles.  A longitudinal incision was made in the midline overlying this mass. Sharp dissection was carried down through the skin, subcutaneous tissue, and paratenon.  The Achilles tendon itself was then split in the midline in line with its fibers.  The area of degenerative change within the central portion of the tendon was identified, this was all sharply excised with a #15 blade.  Less than 50% of the cross-sectional area of the Achilles tendon was excised.  Horizontal mattress sutures of 3-0 Vicryl were used to repair the tendon.  The wound was irrigated copiously.  The paratenon was closed with inverted simple sutures of 3-0 Monocryl.  Skin incision was closed with horizontal mattress sutures of 3-0 nylon.  Sterile dressings were applied followed by a well-padded compression wrap.  Tourniquet was released at 41 minutes.  The patient was awakened from anesthesia and transported to the recovery room in stable condition.  FOLLOWUP PLAN:  The patient will be weightbearing as tolerated on her left foot in a CAM walker boot.  She will follow up with me in 2 weeks for suture removal in the office.  She will take aspirin for DVT prophylaxis in the meantime.     Wylene Simmer, MD     JH/MEDQ  D:  01/27/2014  T:  01/28/2014  Job:  [id number]

## 2014-01-29 ENCOUNTER — Encounter (HOSPITAL_COMMUNITY): Payer: Self-pay | Admitting: Orthopedic Surgery

## 2014-07-01 ENCOUNTER — Other Ambulatory Visit: Payer: Self-pay | Admitting: Family Medicine

## 2014-07-01 DIAGNOSIS — R0989 Other specified symptoms and signs involving the circulatory and respiratory systems: Secondary | ICD-10-CM

## 2014-07-21 ENCOUNTER — Other Ambulatory Visit: Payer: Self-pay

## 2014-08-04 ENCOUNTER — Other Ambulatory Visit: Payer: Self-pay

## 2014-08-04 ENCOUNTER — Ambulatory Visit
Admission: RE | Admit: 2014-08-04 | Discharge: 2014-08-04 | Disposition: A | Discharge status: Home / Self Care | Payer: 59 | Source: Ambulatory Visit | Attending: Family Medicine | Admitting: Family Medicine

## 2014-08-04 DIAGNOSIS — R0989 Other specified symptoms and signs involving the circulatory and respiratory systems: Secondary | ICD-10-CM

## 2014-08-25 ENCOUNTER — Encounter: Payer: Self-pay | Admitting: Gastroenterology

## 2016-03-20 ENCOUNTER — Other Ambulatory Visit: Payer: Self-pay | Admitting: Family Medicine

## 2016-03-20 ENCOUNTER — Other Ambulatory Visit (HOSPITAL_COMMUNITY)
Admission: RE | Admit: 2016-03-20 | Discharge: 2016-03-20 | Disposition: A | Discharge status: Home / Self Care | Payer: 59 | Source: Ambulatory Visit | Attending: Family Medicine | Admitting: Family Medicine

## 2016-03-20 DIAGNOSIS — Z124 Encounter for screening for malignant neoplasm of cervix: Secondary | ICD-10-CM | POA: Diagnosis present

## 2016-03-21 LAB — CYTOLOGY - PAP: Diagnosis: NEGATIVE

## 2018-10-28 ENCOUNTER — Ambulatory Visit (INDEPENDENT_AMBULATORY_CARE_PROVIDER_SITE_OTHER): Payer: 59

## 2018-10-28 ENCOUNTER — Ambulatory Visit (INDEPENDENT_AMBULATORY_CARE_PROVIDER_SITE_OTHER): Payer: 59 | Admitting: Podiatry

## 2018-10-28 ENCOUNTER — Other Ambulatory Visit: Payer: Self-pay

## 2018-10-28 DIAGNOSIS — M79672 Pain in left foot: Secondary | ICD-10-CM

## 2018-10-28 DIAGNOSIS — M722 Plantar fascial fibromatosis: Secondary | ICD-10-CM

## 2018-10-28 DIAGNOSIS — M7672 Peroneal tendinitis, left leg: Secondary | ICD-10-CM

## 2018-10-28 MED ORDER — METHYLPREDNISOLONE 4 MG PO TBPK: ORAL_TABLET | ORAL | 0 refills | Status: AC

## 2018-10-29 ENCOUNTER — Other Ambulatory Visit: Payer: Self-pay | Admitting: Podiatry

## 2018-10-29 DIAGNOSIS — M7672 Peroneal tendinitis, left leg: Secondary | ICD-10-CM

## 2018-10-29 DIAGNOSIS — M722 Plantar fascial fibromatosis: Secondary | ICD-10-CM
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
## 2018-11-19 ENCOUNTER — Other Ambulatory Visit: Payer: Self-pay | Admitting: Orthopedic Surgery

## 2018-11-19 DIAGNOSIS — M545 Low back pain, unspecified: Secondary | ICD-10-CM

## 2018-11-19 DIAGNOSIS — M542 Cervicalgia: Secondary | ICD-10-CM

## 2018-11-26 ENCOUNTER — Other Ambulatory Visit: Payer: Self-pay | Admitting: Orthopedic Surgery

## 2018-12-01 ENCOUNTER — Ambulatory Visit
Admission: RE | Admit: 2018-12-01 | Discharge: 2018-12-01 | Disposition: A | Discharge status: Home / Self Care | Payer: 59 | Source: Ambulatory Visit | Attending: Orthopedic Surgery | Admitting: Orthopedic Surgery

## 2018-12-01 ENCOUNTER — Other Ambulatory Visit: Payer: Self-pay

## 2018-12-01 DIAGNOSIS — M542 Cervicalgia: Secondary | ICD-10-CM

## 2018-12-01 DIAGNOSIS — M545 Low back pain, unspecified: Secondary | ICD-10-CM

## 2018-12-09 ENCOUNTER — Other Ambulatory Visit: Payer: 59

## 2018-12-09 ENCOUNTER — Ambulatory Visit: Payer: 59 | Admitting: Podiatry
